# Patient Record
Sex: Male | Born: 1960
Health system: Southern US, Community
[De-identification: ages and names within clinical notes are randomized; demographics above are authoritative.]

## PROBLEM LIST (undated history)

## (undated) DIAGNOSIS — E785 Hyperlipidemia, unspecified: Secondary | ICD-10-CM

## (undated) DIAGNOSIS — D869 Sarcoidosis, unspecified: Secondary | ICD-10-CM

## (undated) DIAGNOSIS — B019 Varicella without complication: Secondary | ICD-10-CM

## (undated) HISTORY — PX: BRONCHOSCOPY: SUR163

## (undated) HISTORY — PX: OTHER SURGICAL HISTORY: SHX169

## (undated) HISTORY — DX: Varicella without complication: B01.9

## (undated) HISTORY — DX: Sarcoidosis, unspecified: D86.9

## (undated) HISTORY — DX: Hyperlipidemia, unspecified: E78.5

## (undated) HISTORY — PX: COLONOSCOPY: SHX174

## (undated) HISTORY — PX: CARPAL TUNNEL RELEASE: SHX101

---

## 2008-05-01 ENCOUNTER — Ambulatory Visit: Payer: Self-pay | Admitting: Family Medicine

## 2008-05-01 ENCOUNTER — Encounter: Admission: RE | Admit: 2008-05-01 | Discharge: 2008-05-01 | Payer: Self-pay | Admitting: Family Medicine

## 2009-05-31 ENCOUNTER — Ambulatory Visit: Payer: Self-pay | Admitting: Family Medicine

## 2009-07-10 ENCOUNTER — Ambulatory Visit: Payer: Self-pay | Admitting: Family Medicine

## 2010-03-21 ENCOUNTER — Ambulatory Visit: Payer: Self-pay | Admitting: Family Medicine

## 2010-10-15 ENCOUNTER — Ambulatory Visit (INDEPENDENT_AMBULATORY_CARE_PROVIDER_SITE_OTHER): Payer: BC Managed Care – PPO | Admitting: Family Medicine

## 2010-10-15 DIAGNOSIS — K1120 Sialoadenitis, unspecified: Secondary | ICD-10-CM

## 2011-04-22 ENCOUNTER — Ambulatory Visit (INDEPENDENT_AMBULATORY_CARE_PROVIDER_SITE_OTHER)
Admission: RE | Admit: 2011-04-22 | Discharge: 2011-04-22 | Disposition: A | Discharge status: Home / Self Care | Payer: BC Managed Care – PPO | Source: Ambulatory Visit | Attending: Pulmonary Disease | Admitting: Pulmonary Disease

## 2011-04-22 ENCOUNTER — Ambulatory Visit (INDEPENDENT_AMBULATORY_CARE_PROVIDER_SITE_OTHER): Payer: BC Managed Care – PPO | Admitting: Pulmonary Disease

## 2011-04-22 ENCOUNTER — Encounter: Payer: Self-pay | Admitting: Pulmonary Disease

## 2011-04-22 ENCOUNTER — Other Ambulatory Visit: Payer: Self-pay | Admitting: Pulmonary Disease

## 2011-04-22 VITALS — BP 120/76 | HR 80 | Temp 99.0°F | Ht 66.0 in | Wt 165.8 lb

## 2011-04-22 DIAGNOSIS — R053 Chronic cough: Secondary | ICD-10-CM

## 2011-04-22 DIAGNOSIS — R05 Cough: Secondary | ICD-10-CM

## 2011-04-22 DIAGNOSIS — R059 Cough, unspecified: Secondary | ICD-10-CM

## 2011-04-22 MED ORDER — OMEPRAZOLE 40 MG PO CPDR: 40.0000 mg | DELAYED_RELEASE_CAPSULE | Freq: Two times a day (BID) | ORAL | Status: DC

## 2011-04-22 MED ORDER — BENZONATATE 100 MG PO CAPS: 200.0000 mg | ORAL_CAPSULE | Freq: Four times a day (QID) | ORAL | Status: AC | PRN

## 2011-04-22 MED ORDER — HYDROCOD POLST-CPM POLST ER 10-8 MG PO CP12: 1.0000 | ORAL_CAPSULE | Freq: Two times a day (BID) | ORAL | Status: DC | PRN

## 2011-04-22 NOTE — Patient Instructions (Signed)
Will check cxr today, and call you with results. Will treat with cyclical cough protocol.  Please see instruction sheet. No throat clearing (ask your family to point it out if you are doing this), keep hard candy in mouth from awakening to bedtime for next few weeks. Minimize voice use for next few weeks. Will treat aggressively for reflux with omeprazole 40mg  one in am and pm for at least 3 weeks. After finishing the 3 days of cyclical cough protocol, take chlorpheniramine 8mg  at bedtime for postnasal drip for a few weeks. followup with me 2 weeks after finishing the 3 day cyclical cough protocol.

## 2011-04-22 NOTE — Progress Notes (Signed)
  Subjective:    Patient ID: Christian Watson, male    DOB: [birth year], 50 y.o.   MRN: [id number]  HPI The patient is a 50 year old male who I've been asked to see for evaluation of chronic cough.  Patient states that he has had this for approximately 8 months, and started as a typical "cold".  His cold symptoms resolved, but his cough has persisted.  He is been treated with allergy medication for 2 months with no improvement, and ultimately was evaluated by ENT with laryngoscopy showing post cricoid erythema.  He was tried on omeprazole 40 mg a day for a few weeks, followed by Dexilant for 5 weeks, with no change.  He has had a CT of his sinuses this month which showed no acute process.  The patient states that his cough is dry and hacking in nature, and has day-to-day variability.  He does feel a tickle and globus sensation, but is unsure if he has been clearing his throat a lot.  He can think of nothing that has made his cough better, and is unsure if anything is making it worse.  He has no history of asthma, and denies any breathing issues.  He may have some postnasal drip, but denies any significant reflux symptoms.  He has not had a chest x-ray or spirometry.   Review of Systems  Constitutional: Negative for fever and unexpected weight change.  HENT: Positive for sore throat. Negative for ear pain, nosebleeds, congestion, rhinorrhea, sneezing, trouble swallowing, dental problem, postnasal drip and sinus pressure.   Eyes: Negative for redness and itching.  Respiratory: Positive for cough. Negative for chest tightness, shortness of breath and wheezing.   Cardiovascular: Negative for palpitations and leg swelling.  Gastrointestinal: Negative for nausea and vomiting.  Genitourinary: Negative for dysuria.  Musculoskeletal: Negative for joint swelling.  Skin: Negative for rash.  Neurological: Negative for headaches.  Hematological: Does not bruise/bleed easily.  Psychiatric/Behavioral: Negative for  dysphoric mood. The patient is not nervous/anxious.        Objective:   Physical Exam Constitutional:  Well developed, no acute distress  HENT:  Nares patent without discharge, increased turbinates with erythema  Oropharynx without exudate, palate and uvula are normal  Eyes:  Perrla, eomi, no scleral icterus  Neck:  No JVD, no TMG  Cardiovascular:  Normal rate, regular rhythm, no rubs or gallops.  No murmurs        Intact distal pulses  Pulmonary :  Normal breath sounds, no stridor or respiratory distress   No rales, rhonchi, or wheezing  Abdominal:  Soft, nondistended, bowel sounds present.  No tenderness noted.   Musculoskeletal:  No lower extremity edema noted.  Lymph Nodes:  No cervical lymphadenopathy noted  Skin:  No cyanosis noted  Neurologic:  Alert, appropriate, moves all 4 extremities without obvious deficit. =xam       Assessment & Plan:

## 2011-04-22 NOTE — Assessment & Plan Note (Signed)
The patient has a chronic cough of 8 months duration that I suspect is more upper rather than lower airway.  Spirometry today shows no evidence of airflow obstruction, and we will also check a chest x-ray for completeness.  I would like to treat him for all possible irritants to the back of his throat, including postnasal drip, reflux, and cough/throat clearing.  I have reviewed all of the behavioral treatments for chronic cough, and have also given him a patient information sheet for the cyclical cough protocol.  I will see him back in 2 weeks to check on his progress.

## 2011-04-24 ENCOUNTER — Ambulatory Visit (INDEPENDENT_AMBULATORY_CARE_PROVIDER_SITE_OTHER)
Admission: RE | Admit: 2011-04-24 | Discharge: 2011-04-24 | Disposition: A | Discharge status: Home / Self Care | Payer: BC Managed Care – PPO | Source: Ambulatory Visit | Attending: Pulmonary Disease | Admitting: Pulmonary Disease

## 2011-04-24 DIAGNOSIS — R053 Chronic cough: Secondary | ICD-10-CM

## 2011-04-24 DIAGNOSIS — R05 Cough: Secondary | ICD-10-CM

## 2011-04-24 DIAGNOSIS — R059 Cough, unspecified: Secondary | ICD-10-CM

## 2011-04-24 MED ORDER — IOHEXOL 300 MG/ML  SOLN
80.0000 mL | Freq: Once | INTRAMUSCULAR | Status: AC | PRN
  Administered 2011-04-24: 80 mL via INTRAVENOUS

## 2011-04-28 ENCOUNTER — Telehealth: Payer: Self-pay | Admitting: Family Medicine

## 2011-04-28 ENCOUNTER — Telehealth: Payer: Self-pay | Admitting: Pulmonary Disease

## 2011-04-28 NOTE — Telephone Encounter (Signed)
FYI

## 2011-04-28 NOTE — Telephone Encounter (Signed)
Pt is requesting CT results from 04-24-11. Please advise. Carron Curie, CMA

## 2011-04-28 NOTE — Telephone Encounter (Signed)
Christian Watson, he needs to be set up for bronch.   Found out what tue/wed/thurs works for him, keeping in mind recent bronchs we have set up.  730 start time.  He will need fluoro though, small scope, no tb risk.

## 2011-04-29 NOTE — Telephone Encounter (Signed)
[id number] 

## 2011-04-29 NOTE — Telephone Encounter (Signed)
Returning Megan's call can be reached at [phone].Raylene Everts

## 2011-04-29 NOTE — Telephone Encounter (Signed)
Called and spoke with pt.  Pt requests a thurs appt .  Called and spoke with Marcelino Duster at [facility].  Scheduled pt for 05/15/11 at 7:30 am.    Called and spoke with pt.  Informed to arrive at [facility] admitting on 05/15/11 at 6:15am.  NPO after midnight, will need someone with him to drive him home after procedure.   Pt aware of this information and denies any questions.  Also mailed pt a bronch information sheet with all the above info

## 2011-05-15 ENCOUNTER — Other Ambulatory Visit: Payer: Self-pay | Admitting: Pulmonary Disease

## 2011-05-15 ENCOUNTER — Ambulatory Visit (HOSPITAL_COMMUNITY)
Admission: RE | Admit: 2011-05-15 | Discharge: 2011-05-15 | Disposition: A | Discharge status: Home / Self Care | Payer: BC Managed Care – PPO | Source: Ambulatory Visit | Attending: Pulmonary Disease | Admitting: Pulmonary Disease

## 2011-05-15 DIAGNOSIS — R059 Cough, unspecified: Secondary | ICD-10-CM | POA: Insufficient documentation

## 2011-05-15 DIAGNOSIS — R05 Cough: Secondary | ICD-10-CM | POA: Insufficient documentation

## 2011-05-15 DIAGNOSIS — J984 Other disorders of lung: Secondary | ICD-10-CM

## 2011-05-15 DIAGNOSIS — R918 Other nonspecific abnormal finding of lung field: Secondary | ICD-10-CM | POA: Insufficient documentation

## 2011-05-15 DIAGNOSIS — J8410 Pulmonary fibrosis, unspecified: Secondary | ICD-10-CM | POA: Insufficient documentation

## 2011-05-15 LAB — BODY FLUID CELL COUNT WITH DIFFERENTIAL: Total Nucleated Cell Count, Fluid: 1000 cu mm (ref 0–1000)

## 2011-05-17 LAB — CULTURE, RESPIRATORY W GRAM STAIN

## 2011-05-20 ENCOUNTER — Telehealth: Payer: Self-pay | Admitting: Pulmonary Disease

## 2011-05-20 DIAGNOSIS — D869 Sarcoidosis, unspecified: Secondary | ICD-10-CM

## 2011-05-20 NOTE — Telephone Encounter (Signed)
Called and spoke with pt and he is aware that [facility] out of town until Monday.  Pt is aware that i will forward this message to [facility] so he will be able to call the pt with his results.  Pt is very anxious for his results.  thanks

## 2011-05-21 ENCOUNTER — Other Ambulatory Visit: Payer: Self-pay | Admitting: *Deleted

## 2011-05-21 DIAGNOSIS — D869 Sarcoidosis, unspecified: Secondary | ICD-10-CM | POA: Insufficient documentation

## 2011-05-21 MED ORDER — PREDNISONE 20 MG PO TABS: ORAL_TABLET | ORAL | Status: DC

## 2011-05-21 NOTE — Telephone Encounter (Signed)
PT'S SPOUSE DEBRA STATES THAT WAITING UNTIL NEXT WEEK TO GET RESULTS OF BIOPSY IS "NOT AN OPTION". PT IS ANXIOUS TO GET THESE RESULTS NOW. WOULD LIKE ANOTHER DR. TO VIEW RESULTS AND CALL PT AT HOME #. Christian Watson

## 2011-05-21 NOTE — Telephone Encounter (Signed)
Dr. Sherene Sires, will you please look at these results and advise until [facility] returns to office next?  Thank you.

## 2011-05-21 NOTE — Telephone Encounter (Signed)
Discussed by c/w sarcoid, Dr Shelle Iron to arrange f/u and rx but nothing new for now

## 2011-05-28 NOTE — Op Note (Signed)
  NAME:  ANTHONY, TAMBURO NO.:  [id number]  MEDICAL RECORD NO.:  [id number]  LOCATION:  RESP                         FACILITY:  [facility]  PHYSICIAN:  Barbaraann Share, MD,FCCPDATE OF BIRTH:  [birth year] 08, 1961  DATE OF PROCEDURE:  05/15/2011 DATE OF DISCHARGE:                              OPERATIVE REPORT   PROCEDURE:  Flexible fiberoptic bronchoscopy with video.  OPERATOR:  Barbaraann Share, MD, FCCP.  INDICATION:  Pulmonary infiltrates of unknown origin with persistent cough.  ANESTHESIA:  Versed 10 mg IV, Demerol 100 mg IV, topical 1% lidocaine to vocal cords and airways during the procedure.  DESCRIPTION:  After obtaining informed consent, the patient was re- examined and the above sedation was then given.  The fiberoptic scope was passed to the right naris and into the posterior pharynx where there was no lesions or other abnormalities seen.  Vocal cords appeared to be within normal limits and moved bilaterally on phonation.  Scope was then passed into the trachea where it was examined along its entire length down to the level of the carina.  The left and right tracheobronchial trees were examined serially to subsegmental level with no endobronchial abnormality being noted.  Attention was then paid to the right upper lobe where bronchoalveolar lavage was done from the anterior segment of the right upper lobe as well as transbronchial lung biopsies under fluoroscopic guidance.  Attention was then paid to the right middle lobe where BAL was done as well as transbronchial lung biopsies.  Overall, the patient tolerated the procedure quite well and there were no complications.  He remained hemodynamically stable throughout the procedure.  Chest x-ray is pending at time of dictation to rule out pneumothorax, post transbronchial biopsies.     Barbaraann Share, MD,FCCP     [facility]  D:  05/15/2011  T:  05/15/2011  Job:  [id number]  Electronically Signed by Marcelyn Bruins  MDFCCP on 05/28/2011 08:41:02 AM

## 2011-06-11 LAB — FUNGUS CULTURE W SMEAR

## 2011-06-18 ENCOUNTER — Ambulatory Visit (INDEPENDENT_AMBULATORY_CARE_PROVIDER_SITE_OTHER)
Admission: RE | Admit: 2011-06-18 | Discharge: 2011-06-18 | Disposition: A | Discharge status: Home / Self Care | Payer: BC Managed Care – PPO | Source: Ambulatory Visit | Attending: Pulmonary Disease | Admitting: Pulmonary Disease

## 2011-06-18 ENCOUNTER — Ambulatory Visit (INDEPENDENT_AMBULATORY_CARE_PROVIDER_SITE_OTHER): Payer: BC Managed Care – PPO | Admitting: Pulmonary Disease

## 2011-06-18 ENCOUNTER — Encounter: Payer: Self-pay | Admitting: Pulmonary Disease

## 2011-06-18 VITALS — BP 136/80 | HR 83 | Temp 98.5°F | Ht 66.0 in | Wt 168.8 lb

## 2011-06-18 DIAGNOSIS — D869 Sarcoidosis, unspecified: Secondary | ICD-10-CM

## 2011-06-18 MED ORDER — PREDNISONE 20 MG PO TABS: ORAL_TABLET | ORAL | Status: DC

## 2011-06-18 NOTE — Patient Instructions (Addendum)
Decrease prednisone 20mg  to 1 1/2 tabs each day for 2 weeks, then to one tab each day until next visit with me. Will schedule for breathing studies at next visit to establish lung function baseline.  Will also check cxr next visit.  followup with me in 4 weeks.

## 2011-06-18 NOTE — Assessment & Plan Note (Addendum)
The patient is much improved since being on his prednisone, and states that his cough is at least 50% better.  He has no other significant pulmonary symptoms.  I have had a very long discussion with him about the diagnosis of sarcoid, and explained to him that prednisone treat his symptoms, but does not influence the actual disease.  My hope would be to maintain him on prednisone for approximately 2-3 months to resolve his cough, and then try him off the medication for a period of time to see if there is a recurrence.  I have written out a tapering dose schedule for his prednisone, and we'll see him back in 4 weeks with a followup chest x-ray and also full pulmonary function studies to establish his stable baseline.

## 2011-06-18 NOTE — Progress Notes (Signed)
  Subjective:    Patient ID: KYL GIVLER, male    DOB: 12-27-1960, 50 y.o.   MRN: [id number]  HPI The pt comes in today for f/u after his recent bronchoscopy.  He was found to have non-necrotizing granulomas most c/w diagnosis of sarcoid.  The patient has been started on prednisone at 40 mg a day, and comes in for followup.  He has seen a significant improvement in his cough of at least 50%, and has done well overall with the high-dose prednisone.  He denies any significant dyspnea on exertion or lower extremity edema.  He has not had any chest congestion and mucus production.  He did have a chest x-ray today prior to the visit which shows significant improvement in bilateral nodular infiltrates.   Review of Systems  Constitutional: Negative for fever and unexpected weight change.  HENT: Negative for ear pain, nosebleeds, congestion, sore throat, rhinorrhea, sneezing, trouble swallowing, dental problem, postnasal drip and sinus pressure.   Eyes: Negative for redness and itching.  Respiratory: Positive for cough. Negative for chest tightness, shortness of breath and wheezing.   Cardiovascular: Negative for palpitations and leg swelling.  Gastrointestinal: Negative for nausea and vomiting.  Genitourinary: Negative for dysuria.  Musculoskeletal: Negative for joint swelling.  Skin: Negative for rash.  Neurological: Negative for headaches.  Hematological: Does not bruise/bleed easily.  Psychiatric/Behavioral: Negative for dysphoric mood. The patient is not nervous/anxious.        Objective:   Physical Exam Well-developed male in no acute distress Nose without purulence or discharge noted Chest with clear breath sounds throughout, no wheezes or crackles Cardiac exam with regular rate and rhythm Lower extremities without edema, no cyanosis noted Alert and oriented, moves all 4.          Assessment & Plan:

## 2011-06-25 ENCOUNTER — Telehealth: Payer: Self-pay | Admitting: Pulmonary Disease

## 2011-06-25 NOTE — Telephone Encounter (Signed)
Pt is aware that he needs an eye exam due to his sarcoid and that they need to make sure he has no uveitis. He says he will let Dr. [place] know.

## 2011-06-25 NOTE — Telephone Encounter (Signed)
If he just tells his eye doctor he has sarcoid, and to make sure he does not have uveitis.

## 2011-06-25 NOTE — Telephone Encounter (Signed)
I spoke with Christian Watson and he states Christian Watson advised him he should have an eye exam due to his sarcoid. Christian Watson states when he called his eye doctor at [facility] on [address], they stated they did not know what Christian Watson wanted them to check for. Christian Watson would like to know so he can inform his eye doctor. Please advise Dr. Shelle Iron, thanks

## 2011-06-27 LAB — AFB CULTURE WITH SMEAR (NOT AT ARMC)

## 2011-07-21 ENCOUNTER — Ambulatory Visit (INDEPENDENT_AMBULATORY_CARE_PROVIDER_SITE_OTHER)
Admission: RE | Admit: 2011-07-21 | Discharge: 2011-07-21 | Disposition: A | Discharge status: Home / Self Care | Payer: BC Managed Care – PPO | Source: Ambulatory Visit | Attending: Pulmonary Disease | Admitting: Pulmonary Disease

## 2011-07-21 ENCOUNTER — Ambulatory Visit (INDEPENDENT_AMBULATORY_CARE_PROVIDER_SITE_OTHER): Payer: BC Managed Care – PPO | Admitting: Pulmonary Disease

## 2011-07-21 ENCOUNTER — Encounter: Payer: Self-pay | Admitting: Pulmonary Disease

## 2011-07-21 VITALS — BP 120/82 | HR 84 | Temp 98.3°F | Ht 66.0 in | Wt 169.0 lb

## 2011-07-21 DIAGNOSIS — D869 Sarcoidosis, unspecified: Secondary | ICD-10-CM

## 2011-07-21 LAB — PULMONARY FUNCTION TEST

## 2011-07-21 MED ORDER — PREDNISONE 5 MG PO TABS: ORAL_TABLET | ORAL | Status: DC

## 2011-07-21 NOTE — Progress Notes (Signed)
PFT was done today.  

## 2011-07-21 NOTE — Progress Notes (Signed)
  Subjective:    Patient ID: Christian Watson, male    DOB: October 24, 1960, 50 y.o.   MRN: [id number]  HPI Patient comes in today for followup of his known sarcoidosis, primarily manifesting as cough.  He's been treated with a tapering course of prednisone, and is down to 20 mg a day.  His cough has totally resolved, and he denies any breathing issues.  He does think that he feels much better.  His pulmonary function studies today are totally normal.  His chest x-ray shows significant improvement from his original, but only minimal improvement from his prior a month ago.   Review of Systems  Constitutional: Negative for fever and unexpected weight change.  HENT: Negative for ear pain, nosebleeds, congestion, sore throat, rhinorrhea, sneezing, trouble swallowing, dental problem, postnasal drip and sinus pressure.   Eyes: Negative for redness and itching.  Respiratory: Negative for cough, chest tightness, shortness of breath and wheezing.   Cardiovascular: Negative for palpitations and leg swelling.  Gastrointestinal: Negative for nausea and vomiting.  Genitourinary: Negative for dysuria.  Musculoskeletal: Negative for joint swelling.  Skin: Negative for rash.  Neurological: Negative for headaches.  Hematological: Does not bruise/bleed easily.  Psychiatric/Behavioral: Negative for dysphoric mood. The patient is not nervous/anxious.        Objective:   Physical Exam Well-developed male in no acute distress Nose without purulence or discharge noted Chest totally clear to auscultation, no wheezes Cardiac exam with regular rate and rhythm Lower extremities without edema, no cyanosis noted Alert oriented, moves all 4 extremities.       Assessment & Plan:

## 2011-07-21 NOTE — Patient Instructions (Signed)
Decrease prednisone to 15mg  a day for 7 days, then 10mg  a day for 7 days, then 5mg  a day for 7 days, then stop.  Please call me if the cough begins to return. followup with me in 4mos if doing well.

## 2011-07-21 NOTE — Assessment & Plan Note (Signed)
The patient has done very well on tapering prednisone, and is symptom-free on his current dose.  The plan will be to wean him off prednisone completely, and monitor for return of symptoms.  If his cough does return, we'll start him on inhaled corticosteroids in the form of Qvar.  The patient is to followup with me in 4 months, but is to call if his symptoms do return during his taper or once he is off the prednisone.

## 2011-08-11 ENCOUNTER — Telehealth: Payer: Self-pay | Admitting: Pulmonary Disease

## 2011-08-11 NOTE — Telephone Encounter (Signed)
I spoke with pt and he states he has developed a dry cough x the weekend. He does not get anything up. Denies any wheezing, chest tightness, SOB, fever, nausea, vomiting. Pt will finish prednisone tomorrow. He states KC advised him if he started coughing again to call him. Please advise Dr. Shelle Iron, thanks

## 2011-08-11 NOTE — Telephone Encounter (Signed)
Called and spoke with pt and informed him of KC's recs.  Pt states he will come by tomorrow morning around 8:30 am to be shown how to use the qvar and to be given the sample.  Will leave message in my box to document once i discuss inhaler with pt.

## 2011-08-11 NOTE — Telephone Encounter (Signed)
The plan was to start him on qvar if his cough returned off prednisone See if we have a sample of qvar, and have him come by to get and get instructed how to use. qvar  2 inhalations am and pm.  Rinse mouth well.

## 2011-08-12 NOTE — Telephone Encounter (Signed)
Pt came in this morning and i instructed him on how to use the qvar.  Pt verbalized understanding and denied any questions.

## 2011-08-20 ENCOUNTER — Encounter: Payer: Self-pay | Admitting: Pulmonary Disease

## 2011-11-17 ENCOUNTER — Ambulatory Visit (INDEPENDENT_AMBULATORY_CARE_PROVIDER_SITE_OTHER): Payer: BC Managed Care – PPO | Admitting: Pulmonary Disease

## 2011-11-17 ENCOUNTER — Encounter: Payer: Self-pay | Admitting: Pulmonary Disease

## 2011-11-17 VITALS — BP 120/78 | HR 74 | Temp 98.0°F | Ht 66.0 in | Wt 172.8 lb

## 2011-11-17 DIAGNOSIS — D869 Sarcoidosis, unspecified: Secondary | ICD-10-CM

## 2011-11-17 NOTE — Assessment & Plan Note (Signed)
The patient is doing very well from a pulmonary standpoint.  He has no significant cough, and feels that his exertional tolerance as normal.  I would like to keep him off all medication for now, but he is to call if his cough worsens.  He is to follow up with me in 6 months, and we will check a chest x-ray at that time.

## 2011-11-17 NOTE — Progress Notes (Signed)
  Subjective:    Patient ID: Christian Watson, male    DOB: 10/10/1960, 51 y.o.   MRN: [id number]  HPI The patient comes in today for followup of his known sarcoidosis.  This primarily manifested as cough, and no significant shortness of breath.  He has been treated with a short course of prednisone with near total resolution.  He currently rates his cough a 0-1/10, and denies any shortness of breath issues.   Review of Systems  Constitutional: Negative for fever and unexpected weight change.  HENT: Negative for ear pain, nosebleeds, congestion, sore throat, rhinorrhea, sneezing, trouble swallowing, dental problem, postnasal drip and sinus pressure.   Eyes: Negative for redness and itching.  Respiratory: Positive for cough. Negative for chest tightness, shortness of breath and wheezing.   Cardiovascular: Negative for palpitations and leg swelling.  Gastrointestinal: Negative for nausea and vomiting.  Genitourinary: Negative for dysuria.  Musculoskeletal: Negative for joint swelling.  Skin: Negative for rash.  Neurological: Negative for headaches.  Hematological: Does not bruise/bleed easily.  Psychiatric/Behavioral: Negative for dysphoric mood. The patient is not nervous/anxious.        Objective:   Physical Exam Well-developed male in no acute distress Nose without purulence or discharge noted Chest totally clear to auscultation Cardiac exam regular rate and rhythm, 1/6 systolic murmur Lower extremities without edema, no cyanosis Alert and oriented, moves all 4 extremities.       Assessment & Plan:

## 2011-11-17 NOTE — Patient Instructions (Signed)
Stay active, let us know if cough worsens followup with me in 6mos, and will do followup cxr same visit

## 2012-05-17 ENCOUNTER — Ambulatory Visit (INDEPENDENT_AMBULATORY_CARE_PROVIDER_SITE_OTHER)
Admission: RE | Admit: 2012-05-17 | Discharge: 2012-05-17 | Disposition: A | Discharge status: Home / Self Care | Payer: BC Managed Care – PPO | Source: Ambulatory Visit | Attending: Pulmonary Disease | Admitting: Pulmonary Disease

## 2012-05-17 ENCOUNTER — Ambulatory Visit (INDEPENDENT_AMBULATORY_CARE_PROVIDER_SITE_OTHER): Payer: BC Managed Care – PPO | Admitting: Pulmonary Disease

## 2012-05-17 ENCOUNTER — Encounter: Payer: Self-pay | Admitting: Pulmonary Disease

## 2012-05-17 VITALS — BP 120/80 | HR 76 | Temp 98.6°F | Ht 66.0 in | Wt 165.4 lb

## 2012-05-17 DIAGNOSIS — R0602 Shortness of breath: Secondary | ICD-10-CM

## 2012-05-17 DIAGNOSIS — D869 Sarcoidosis, unspecified: Secondary | ICD-10-CM

## 2012-05-17 MED ORDER — PREDNISONE 10 MG PO TABS: ORAL_TABLET | ORAL | Status: DC

## 2012-05-17 NOTE — Progress Notes (Signed)
  Subjective:    Patient ID: Christian Watson, male    DOB: 11-Aug-1960, 51 y.o.   MRN: [id number]  HPI The patient comes in today for followup of his known sarcoidosis.  He is fairly asymptomatic, and the only issue he has had is a mild chronic cough with some dyspnea on exertion.  His last episode was last fall, and responded well to a few week course of prednisone.  He has had no issues in total recently, when he has noticed an increased dry cough and some shortness of breath.  He is also feeling some tightness in his chest.  He denies any mucus or purulence.   Review of Systems  Constitutional: Negative for fever and unexpected weight change.  HENT: Negative for ear pain, nosebleeds, congestion, sore throat, rhinorrhea, sneezing, trouble swallowing, dental problem, postnasal drip and sinus pressure.   Eyes: Negative for redness and itching.  Respiratory: Positive for cough and shortness of breath. Negative for chest tightness and wheezing.   Cardiovascular: Negative for palpitations and leg swelling.  Gastrointestinal: Negative for nausea and vomiting.  Genitourinary: Negative for dysuria.  Musculoskeletal: Negative for joint swelling.  Skin: Negative for rash.  Neurological: Negative for headaches.  Hematological: Does not bruise/bleed easily.  Psychiatric/Behavioral: Negative for dysphoric mood. The patient is not nervous/anxious.        Objective:   Physical Exam Well-developed male in no acute distress Nose without purulence or discharge noted Oropharynx clear Chest totally clear to auscultation, no wheezing Cardiac exam with regular rate and rhythm Lower extremities without edema, no cyanosis Alert and oriented, moves all 4 extremities.       Assessment & Plan:

## 2012-05-17 NOTE — Assessment & Plan Note (Signed)
The patient has done very well overall from a sarcoid standpoint.  His chest x-ray today is stable, but he has had some increased cough and shortness of breath.  His sarcoid has always manifested primarily as airway symptoms.  I have discussed with him possible treatment with a few weeks of prednisone, versus putting him on inhaled corticosteroids.  The patient would rather take the prednisone for quick relief, but is willing to go on inhaled medication if this is going to be a recurring theme.  He is to let me know if his cough persists or comes back off prednisone.  He will need pulmonary function studies at his next visit.

## 2012-05-17 NOTE — Patient Instructions (Addendum)
Will treat you with prednisone, but let me know if your symptoms do not resolve or if they return after discontinuing the prednisone followup with me in 6mos, but call if having symptoms.

## 2012-11-15 ENCOUNTER — Ambulatory Visit: Payer: BC Managed Care – PPO | Admitting: Pulmonary Disease

## 2012-11-17 ENCOUNTER — Ambulatory Visit (INDEPENDENT_AMBULATORY_CARE_PROVIDER_SITE_OTHER): Payer: BC Managed Care – PPO | Admitting: Pulmonary Disease

## 2012-11-17 ENCOUNTER — Encounter: Payer: Self-pay | Admitting: Pulmonary Disease

## 2012-11-17 VITALS — BP 114/74 | HR 66 | Temp 98.5°F | Wt 156.2 lb

## 2012-11-17 DIAGNOSIS — D869 Sarcoidosis, unspecified: Secondary | ICD-10-CM

## 2012-11-17 NOTE — Progress Notes (Signed)
  Subjective:    Patient ID: Christian Watson, male    DOB: 01-20-1961, 52 y.o.   MRN: [id number]  HPI Patient comes in today for followup of his pulmonary sarcoidosis.  He has done very well overall, and has not had a recurrence of his cough and no shortness of breath.  He denies any joint or skin symptoms.  He is overdue for his pulmonary function studies for monitoring.   Review of Systems  Constitutional: Negative for fever and unexpected weight change.  HENT: Negative for ear pain, nosebleeds, congestion, sore throat, rhinorrhea, sneezing, trouble swallowing, dental problem, postnasal drip and sinus pressure.   Eyes: Negative for redness and itching.  Respiratory: Negative for cough, chest tightness, shortness of breath and wheezing.   Cardiovascular: Negative for palpitations and leg swelling.  Gastrointestinal: Negative for nausea and vomiting.  Genitourinary: Negative for dysuria.  Musculoskeletal: Negative for joint swelling.  Skin: Negative for rash.  Neurological: Negative for headaches.  Hematological: Does not bruise/bleed easily.  Psychiatric/Behavioral: Negative for dysphoric mood. The patient is not nervous/anxious.        Objective:   Physical Exam Well-developed male in no acute distress Nose without purulence or discharge noted Neck without lymphadenopathy or thyromegaly Chest totally clear to auscultation, no wheezing Cardiac exam with regular rate and rhythm Lower extremities without edema, no cyanosis Alert and oriented, moves all 4 extremities.       Assessment & Plan:

## 2012-11-17 NOTE — Patient Instructions (Addendum)
Stay active  Will schedule for breathing studies over the next 4-8 weeks at your convenience.  Will call you with results. followup with me in one year, but call if change in breathing or your cough returns.

## 2012-11-17 NOTE — Assessment & Plan Note (Signed)
The patient is doing very well from a sarcoid standpoint.  He has no shortness of breath or cough at this time, and feels that he is doing well.  He denies any constitutional symptoms.  I would like to schedule him for pulmonary function studies to make sure that his lung function is remaining stable, and we'll see him back in one year.

## 2012-12-15 ENCOUNTER — Ambulatory Visit (INDEPENDENT_AMBULATORY_CARE_PROVIDER_SITE_OTHER): Payer: BC Managed Care – PPO | Admitting: Pulmonary Disease

## 2012-12-15 DIAGNOSIS — D869 Sarcoidosis, unspecified: Secondary | ICD-10-CM

## 2012-12-15 LAB — PULMONARY FUNCTION TEST

## 2012-12-15 NOTE — Progress Notes (Signed)
PFT done today. 

## 2012-12-27 ENCOUNTER — Telehealth: Payer: Self-pay | Admitting: Pulmonary Disease

## 2012-12-27 NOTE — Telephone Encounter (Signed)
Please let pt know that breathing studies are stable.  Look good.

## 2012-12-28 NOTE — Telephone Encounter (Signed)
Results have been explained to patient, pt expressed understanding. Nothing further needed.  

## 2013-03-11 ENCOUNTER — Ambulatory Visit (INDEPENDENT_AMBULATORY_CARE_PROVIDER_SITE_OTHER): Payer: BC Managed Care – PPO | Admitting: Family Medicine

## 2013-03-11 ENCOUNTER — Encounter: Payer: Self-pay | Admitting: Family Medicine

## 2013-03-11 VITALS — BP 130/70 | Temp 101.0°F | Wt 152.0 lb

## 2013-03-11 DIAGNOSIS — J209 Acute bronchitis, unspecified: Secondary | ICD-10-CM

## 2013-03-11 MED ORDER — AMOXICILLIN 875 MG PO TABS: 875.0000 mg | ORAL_TABLET | Freq: Two times a day (BID) | ORAL | Status: DC

## 2013-03-11 NOTE — Progress Notes (Signed)
  Subjective:    Patient ID: Christian Watson, male    DOB: [birth year], 52 y.o.   MRN: [id number]  HPI He has a five-day history this started with rhinorrhea, nasal congestion and chest tightness. He did try some theraflu. Yesterday he noted fever, chills, worsening cough and congestion. No earache or sore throat. He does not smoke and has no allergies.  Review of Systems     Objective:   Physical Exam alert and in no distress. Tympanic membranes and canals are normal. Throat is clear. Tonsils are normal. Neck is supple without adenopathy or thyromegaly. Cardiac exam shows a regular sinus rhythm without murmurs or gallops. Lungs are clear to auscultation.        Assessment & Plan:  Acute bronchitis - Plan: amoxicillin (AMOXIL) 875 MG tablet  I explained that since he has a fever and increasing fatigue and in the fact that it is the weekend, I will place him on Amoxil. Recommend he take the full dosing and call if not entirely better.

## 2013-03-11 NOTE — Patient Instructions (Signed)
Take all the antibiotic and if not totally back to normal call me 

## 2013-11-17 ENCOUNTER — Ambulatory Visit (INDEPENDENT_AMBULATORY_CARE_PROVIDER_SITE_OTHER): Payer: BC Managed Care – PPO | Admitting: Pulmonary Disease

## 2013-11-17 ENCOUNTER — Ambulatory Visit (INDEPENDENT_AMBULATORY_CARE_PROVIDER_SITE_OTHER)
Admission: RE | Admit: 2013-11-17 | Discharge: 2013-11-17 | Disposition: A | Discharge status: Home / Self Care | Payer: BC Managed Care – PPO | Source: Ambulatory Visit | Attending: Pulmonary Disease | Admitting: Pulmonary Disease

## 2013-11-17 ENCOUNTER — Encounter (INDEPENDENT_AMBULATORY_CARE_PROVIDER_SITE_OTHER): Payer: Self-pay

## 2013-11-17 ENCOUNTER — Encounter: Payer: Self-pay | Admitting: Pulmonary Disease

## 2013-11-17 VITALS — BP 124/78 | HR 72 | Temp 99.2°F | Ht 66.0 in | Wt 166.8 lb

## 2013-11-17 DIAGNOSIS — D869 Sarcoidosis, unspecified: Secondary | ICD-10-CM

## 2013-11-17 NOTE — Assessment & Plan Note (Signed)
The patient is doing well from a sarcoid this is standpoint, with very few symptoms and nothing that is impacting his quality of life. His pulmonary function studies were totally normal last year, and I will check a chest x-ray today for completeness. Will see him back in one year, but he is to call me if he has increased symptoms.

## 2013-11-17 NOTE — Patient Instructions (Signed)
Will check chest xray today, and call you with results. followup with me in one year, but call if issues arise.

## 2013-11-17 NOTE — Progress Notes (Signed)
   Subjective:    Patient ID: Christian Watson, male    DOB: 08/06/1960, 53 y.o.   MRN: [id number]  HPI The patient comes in today for followup of his known pulmonary sarcoidosis. He is been doing well since last visit, with no significant shortness of breath or cough. He also denies any constitutional symptoms. His last pulmonary function studies were last May and were totally normal. He is due for a chest x-ray today.   Review of Systems  Constitutional: Negative for fever and unexpected weight change.  HENT: Negative for congestion, dental problem, ear pain, nosebleeds, postnasal drip, rhinorrhea, sinus pressure, sneezing, sore throat and trouble swallowing.   Eyes: Negative for redness and itching.  Respiratory: Positive for cough and shortness of breath. Negative for chest tightness and wheezing.        Occasional issues  Cardiovascular: Negative for palpitations and leg swelling.  Gastrointestinal: Negative for nausea and vomiting.  Genitourinary: Negative for dysuria.  Musculoskeletal: Negative for joint swelling.  Skin: Negative for rash.  Neurological: Negative for headaches.  Hematological: Does not bruise/bleed easily.  Psychiatric/Behavioral: Negative for dysphoric mood. The patient is not nervous/anxious.        Objective:   Physical Exam Wd male in nad Nose without purulence or d/c noted. Neck without LN or TMG Chest with totally clear bs, no wheezing or crackles. Cor with rrr LE without edema, no cyanosis Alert and oriented, moves all 4.        Assessment & Plan:

## 2013-11-29 ENCOUNTER — Telehealth: Payer: Self-pay | Admitting: Family Medicine

## 2013-11-29 NOTE — Telephone Encounter (Signed)
Per Dr. Susann GivensLalonde note called pt advised needs CPE he states no, not right now.  He will call us when ready.

## 2014-05-24 ENCOUNTER — Encounter: Payer: Self-pay | Admitting: Internal Medicine

## 2014-05-24 ENCOUNTER — Ambulatory Visit (INDEPENDENT_AMBULATORY_CARE_PROVIDER_SITE_OTHER): Payer: BC Managed Care – PPO | Admitting: Internal Medicine

## 2014-05-24 VITALS — BP 140/82 | HR 73 | Temp 99.3°F | Ht 66.0 in | Wt 172.0 lb

## 2014-05-24 DIAGNOSIS — R059 Cough, unspecified: Secondary | ICD-10-CM

## 2014-05-24 DIAGNOSIS — R05 Cough: Secondary | ICD-10-CM

## 2014-05-24 DIAGNOSIS — D869 Sarcoidosis, unspecified: Secondary | ICD-10-CM

## 2014-05-24 MED ORDER — PREDNISONE 10 MG PO TABS: ORAL_TABLET | ORAL | Status: DC

## 2014-05-24 MED ORDER — AZITHROMYCIN 250 MG PO TABS: ORAL_TABLET | ORAL | Status: DC

## 2014-05-24 MED ORDER — TRAMADOL HCL 50 MG PO TABS: ORAL_TABLET | ORAL | Status: DC

## 2014-05-24 NOTE — Progress Notes (Signed)
Subjective:     Patient ID: Christian SearingSteve K Watson, male   DOB: 19-Dec-1960 MRN: [id number]  HPI  [id number] yowm never smoker dx with h/o sarcoid required prednisone short courses only per Dr Shelle Ironlance  Presented with chronic cough originally  CT chest 03/2011:  LN, pulmonary nodular infiltrates, spleen abnl.  FOB 2012:  TBBX with non-necrotizing granulomas, negative stains.  Pred 40mg /day 05/2011 tapered to off.  Total resolution of cough PFT's 06/2011:  Totally normal  CXR 04/2012:  No change mild ISLD Pred course 04/2012 for cough.  PFT's 2014:  Totally normal   05/24/2014 acute  [facility] office visit/ [facility]   Chief Complaint  Patient presents with  . Acute Visit    Pt states "got a cold a couple wks ago"- c/o persistant cough since then- prod with minimal yellow sputum.  He also c/o chest congestion and minimal SOB.       No obvious day to day or daytime variabilty or assoc chronic cough or cp or chest tightness, subjective wheeze overt sinus or hb symptoms. No unusual exp hx or h/o childhood pna/ asthma or knowledge of premature birth.  Sleeping ok without nocturnal  or early am exacerbation  of respiratory  c/o's or need for noct saba. Also denies any obvious fluctuation of symptoms with weather or environmental changes or other aggravating or alleviating factors except as outlined above   Current Medications, Allergies, Complete Past Medical History, Past Surgical History, Family History, and Social History were reviewed in [facility] electronic medical record.  ROS  The following are not active complaints unless bolded sore throat, dysphagia, dental problems, itching, sneezing,  nasal congestion or excess/ purulent secretions, ear ache,   fever, chills, sweats, unintended wt loss, pleuritic or exertional cp, hemoptysis,  orthopnea pnd or leg swelling, presyncope, palpitations, heartburn, abdominal pain, anorexia, nausea, vomiting, diarrhea  or change in bowel or urinary habits, change  in stools or urine, dysuria,hematuria,  rash, arthralgias, visual complaints, headache, numbness weakness or ataxia or problems with walking or coordination,  change in mood/affect or memory.      Review of Systems     Objective:   Physical Exam amb wm nad  Wt Readings from Last 3 Encounters:  05/24/14 172 lb (78.019 kg)  11/17/13 166 lb 12.8 oz (75.66 kg)  03/11/13 152 lb (68.947 kg)      HEENT: nl dentition, turbinates, and orophanx. Nl external ear canals without cough reflex   NECK :  without JVD/Nodes/TM/ nl carotid upstrokes bilaterally   LUNGS: no acc muscle use, clear to A and P bilaterally without cough on insp or exp maneuvers   CV:  RRR  no s3 or murmur or increase in P2, no edema   ABD:  soft and nontender with nl excursion in the supine position. No bruits or organomegaly, bowel sounds nl  MS:  warm without deformities, calf tenderness, cyanosis or clubbing  SKIN: warm and dry without lesions    NEURO:  alert, approp, no deficits     cxr 11/17/13 1. No acute cardiopulmonary disease.  2. Grossly unchanged perihilar interstitial opacities and suspected  hilar adenopathy - findings compatible with provided history of  pulmonary sarcoidosis.     Assessment:

## 2014-05-24 NOTE — Patient Instructions (Addendum)
zpak Prednisone 10 mg take  4 each am x 2 days,   2 each am x 2 days,  1 each am x 2 days and stop  Pepcid 20 mg after supper until no longer coughing.   Take delsym two tsp every 12 hours and supplement if needed with  tramadol 50 mg up to 2 every 4 hours to suppress the urge to cough. Swallowing water or using ice chips/non mint and menthol containing candies (such as lifesavers or sugarless jolly ranchers) are also effective.  You should rest your voice and avoid activities that you know make you cough.  Once you have eliminated the cough for 3 straight days try reducing the tramadol first,  then the delsym as tolerated.

## 2014-05-25 DIAGNOSIS — R059 Cough, unspecified: Secondary | ICD-10-CM | POA: Insufficient documentation

## 2014-05-25 DIAGNOSIS — R05 Cough: Secondary | ICD-10-CM | POA: Insufficient documentation

## 2014-05-25 NOTE — Assessment & Plan Note (Signed)
Presented with chronic cough CT chest 03/2011:  LN, pulmonary nodular infiltrates, spleen abnl.  FOB 2012:  TBBX with non-necrotizing granulomas, negative stains.  Pred 40mg /day 05/2011 tapered to off.  Total resolution of cough PFT's 06/2011:  Totally normal  CXR 04/2012:  No change mild ISLD Pred course 04/2012 for cough.  PFT's 2014:  Totally normal   Based on abrupt onset with uri like prodrome doubt the cough is now due to flare of sarcoid but if not eliminated with rx (see cough) asked him to return to see Dr Shelle Ironlance to regroup within 2 weeks with a cxr

## 2014-05-25 NOTE — Assessment & Plan Note (Addendum)
Explained the natural history of uri  And it's relationship to lingering cough and cyclical mechanisms that can ensue  Will rx with zpak as very low likelihood any significant bacterial pathogen involved and it has antiinflammatory properties that may help plus eliminate cyclical cough with tramadol and add noct h2 also empirically to reduce likelihood of noct acid related cough.  See instructions for specific recommendations which were reviewed directly with the patient who was given a copy with highlighter outlining the key components.

## 2014-11-20 ENCOUNTER — Ambulatory Visit (INDEPENDENT_AMBULATORY_CARE_PROVIDER_SITE_OTHER)
Admission: RE | Admit: 2014-11-20 | Discharge: 2014-11-20 | Disposition: A | Discharge status: Home / Self Care | Payer: 59 | Source: Ambulatory Visit | Attending: Pulmonary Disease | Admitting: Pulmonary Disease

## 2014-11-20 ENCOUNTER — Ambulatory Visit (INDEPENDENT_AMBULATORY_CARE_PROVIDER_SITE_OTHER): Payer: 59 | Admitting: Pulmonary Disease

## 2014-11-20 ENCOUNTER — Encounter: Payer: Self-pay | Admitting: Pulmonary Disease

## 2014-11-20 ENCOUNTER — Encounter (INDEPENDENT_AMBULATORY_CARE_PROVIDER_SITE_OTHER): Payer: Self-pay

## 2014-11-20 VITALS — BP 122/76 | HR 79 | Temp 98.0°F | Ht 66.0 in | Wt 164.6 lb

## 2014-11-20 DIAGNOSIS — D869 Sarcoidosis, unspecified: Secondary | ICD-10-CM | POA: Diagnosis not present

## 2014-11-20 NOTE — Progress Notes (Signed)
   Subjective:    Patient ID: Christian Watson, male    DOB: 1961/05/29, 54 y.o.   MRN: [id number]  HPI The patient comes in today for follow-up of his known sarcoidosis. He feels that his breathing is at baseline, and has no issues with exertional tolerance. He is not having any cough, joint complaints, or constitutional symptoms.   Review of Systems  Constitutional: Negative for fever and unexpected weight change.  HENT: Negative for congestion, dental problem, ear pain, nosebleeds, postnasal drip, rhinorrhea, sinus pressure, sneezing, sore throat and trouble swallowing.   Eyes: Negative for redness and itching.  Respiratory: Negative for cough, chest tightness, shortness of breath and wheezing.   Cardiovascular: Negative for palpitations and leg swelling.  Gastrointestinal: Negative for nausea and vomiting.  Genitourinary: Negative for dysuria.  Musculoskeletal: Negative for joint swelling.  Skin: Negative for rash.  Neurological: Negative for headaches.  Hematological: Does not bruise/bleed easily.  Psychiatric/Behavioral: Negative for dysphoric mood. The patient is not nervous/anxious.        Objective:   Physical Exam Well-developed male in no acute distress Nose without purulence or discharge noted Neck without lymphadenopathy or thyromegaly Chest totally clear to auscultation, no crackles Cardiac exam with regular rate and rhythm Lower extremities without edema, no cyanosis Alert and oriented, moves all 4 extremities.       Assessment & Plan:

## 2014-11-20 NOTE — Assessment & Plan Note (Signed)
The patient continues to do well from a sarcoid standpoint, with no dyspnea on exertion or cough at this time. He has no joint complaints or constitutional symptoms. Will check a chest x-ray today, and if stable, would not recommend any treatment for this since he is asymptomatic.

## 2014-11-20 NOTE — Patient Instructions (Signed)
Will check chest xray today and call you with results. followup in one year if doing well, but call if issues.

## 2015-04-03 ENCOUNTER — Ambulatory Visit (INDEPENDENT_AMBULATORY_CARE_PROVIDER_SITE_OTHER): Payer: 59 | Admitting: Emergency Medicine

## 2015-04-03 ENCOUNTER — Encounter: Payer: Self-pay | Admitting: Emergency Medicine

## 2015-04-03 VITALS — BP 122/84 | HR 87 | Temp 98.2°F | Resp 18 | Ht 66.0 in | Wt 166.0 lb

## 2015-04-03 DIAGNOSIS — S61011A Laceration without foreign body of right thumb without damage to nail, initial encounter: Secondary | ICD-10-CM

## 2015-04-03 DIAGNOSIS — Z23 Encounter for immunization: Secondary | ICD-10-CM

## 2015-04-03 DIAGNOSIS — M79644 Pain in right finger(s): Secondary | ICD-10-CM

## 2015-04-03 DIAGNOSIS — M25541 Pain in joints of right hand: Secondary | ICD-10-CM

## 2015-04-03 NOTE — Progress Notes (Signed)
Subjective:  Patient ID: Christian Watson, male    DOB: July 13, 1961  Age: 54 y.o. MRN: [id number]  CC: Laceration   HPI Christian Watson presents  following a laceration to the right thumb on a knife. He has no other injury. He is not current on tetanus. Took no medication for it. This happened a short, no  History Christian Watson has no past medical history on file.   He has past surgical history that includes cyst removed (as a child).   His  family history includes Cancer in his maternal grandfather and paternal grandmother; Heart disease in his maternal grandfather, maternal grandmother, paternal grandfather, and paternal grandmother.  He   reports that he has never smoked. He does not have any smokeless tobacco history on file. He reports that he does not use illicit drugs. His alcohol history is not on file.  No outpatient prescriptions prior to visit.   No facility-administered medications prior to visit.    Social History   Social History  . Marital Status: Married    Spouse Name: N/A  . Number of Children: Y  . Years of Education: N/A   Occupational History  . business owner    Social History Main Topics  . Smoking status: Never Smoker   . Smokeless tobacco: None  . Alcohol Use: None  . Drug Use: No  . Sexual Activity: Not Asked   Other Topics Concern  . None   Social History Narrative     Review of Systems  Constitutional: Negative for fever, chills and appetite change.  HENT: Negative for congestion, ear pain, postnasal drip, sinus pressure and sore throat.   Eyes: Negative for pain and redness.  Respiratory: Negative for cough, shortness of breath and wheezing.   Cardiovascular: Negative for leg swelling.  Gastrointestinal: Negative for nausea, vomiting, abdominal pain, diarrhea, constipation and blood in stool.  Endocrine: Negative for polyuria.  Genitourinary: Negative for dysuria, urgency, frequency and flank pain.  Musculoskeletal: Negative  for gait problem.  Skin: Negative for rash.  Neurological: Negative for weakness and headaches.  Psychiatric/Behavioral: Negative for confusion and decreased concentration. The patient is not nervous/anxious.     Objective:  BP 122/84 mmHg  Pulse 87  Temp(Src) 98.2 F (36.8 C) (Oral)  Resp 18  Ht  (1.676 m)  Wt 166 lb (75.297 kg)  BMI 26.81 kg/m2  SpO2 98%  Physical Exam  Constitutional: He is oriented to person, place, and time. He appears well-developed and well-nourished. No distress.  HENT:  Head: Normocephalic and atraumatic.  Right Ear: External ear normal.  Left Ear: External ear normal.  Nose: Nose normal.  Eyes: Conjunctivae and EOM are normal. Pupils are equal, round, and reactive to light. No scleral icterus.  Neck: Normal range of motion. Neck supple. No tracheal deviation present.  Cardiovascular: Normal rate, regular rhythm and normal heart sounds.   Pulmonary/Chest: Effort normal. No respiratory distress. He has no wheezes. He has no rales.  Abdominal: He exhibits no mass. There is no tenderness. There is no rebound and no guarding.  Musculoskeletal: He exhibits no edema.  Lymphadenopathy:    He has no cervical adenopathy.  Neurological: He is alert and oriented to person, place, and time. Coordination normal.  Skin: Skin is warm and dry. No rash noted.  Psychiatric: He has a normal mood and affect. His behavior is normal.      Assessment & Plan:   Christian Watson was seen today for laceration.  Diagnoses and all orders  for this visit:  Laceration of thumb, right, initial encounter  Pain in thumb joint with movement, right  Other orders -     Tdap vaccine greater than or equal to 7yo IM   Christian Watson does not currently have medications on file.  No orders of the defined types were placed in this encounter.    Appropriate red flag conditions were discussed with the patient as well as actions that should be taken.  Patient expressed his  understanding.  Follow-up: Return for suture removal in 7-10 days.  Carmelina Dane, MD

## 2015-04-03 NOTE — Progress Notes (Signed)
I directly observed Ms. Race and participated in the laceration repair on this patient. Verbal consent obtained from patient.  Local anesthesia with 5cc Lidocaine 2% without epinephrine.  Wound scrubbed with soap and water and rinsed.  Wound closed with #4 4-0 Prolene simple interrupted sutures.  Wound cleansed and dressed.

## 2015-04-03 NOTE — Patient Instructions (Addendum)
Tdap Vaccine (Tetanus, Diphtheria, Pertussis): What You Need to Know 1. Why get vaccinated? Tetanus, diphtheria and pertussis can be very serious diseases, even for adolescents and adults. Tdap vaccine can protect us from these diseases. TETANUS (Lockjaw) causes painful muscle tightening and stiffness, usually all over the body.  It can lead to tightening of muscles in the head and neck so you can't open your mouth, swallow, or sometimes even breathe. Tetanus kills about 1 out of 5 people who are infected. DIPHTHERIA can cause a thick coating to form in the back of the throat.  It can lead to breathing problems, paralysis, heart failure, and death. PERTUSSIS (Whooping Cough) causes severe coughing spells, which can cause difficulty breathing, vomiting and disturbed sleep.  It can also lead to weight loss, incontinence, and rib fractures. Up to 2 in 100 adolescents and 5 in 100 adults with pertussis are hospitalized or have complications, which could include pneumonia or death. These diseases are caused by bacteria. Diphtheria and pertussis are spread from person to person through coughing or sneezing. Tetanus enters the body through cuts, scratches, or wounds. Before vaccines, the [place] saw as many as 200,000 cases a year of diphtheria and pertussis, and hundreds of cases of tetanus. Since vaccination began, tetanus and diphtheria have dropped by about 99% and pertussis by about 80%. 2. Tdap vaccine Tdap vaccine can protect adolescents and adults from tetanus, diphtheria, and pertussis. One dose of Tdap is routinely given at age 11 or 12. People who did not get Tdap at that age should get it as soon as possible. Tdap is especially important for health care professionals and anyone having close contact with a baby younger than 12 months. Pregnant women should get a dose of Tdap during every pregnancy, to protect the newborn from pertussis. Infants are most at risk for severe, life-threatening  complications from pertussis. A similar vaccine, called Td, protects from tetanus and diphtheria, but not pertussis. A Td booster should be given every 10 years. Tdap may be given as one of these boosters if you have not already gotten a dose. Tdap may also be given after a severe cut or burn to prevent tetanus infection. Your doctor can give you more information. Tdap may safely be given at the same time as other vaccines. 3. Some people should not get this vaccine  If you ever had a life-threatening allergic reaction after a dose of any tetanus, diphtheria, or pertussis containing vaccine, OR if you have a severe allergy to any part of this vaccine, you should not get Tdap. Tell your doctor if you have any severe allergies.  If you had a coma, or long or multiple seizures within 7 days after a childhood dose of DTP or DTaP, you should not get Tdap, unless a cause other than the vaccine was found. You can still get Td.  Talk to your doctor if you:  have epilepsy or another nervous system problem,  had severe pain or swelling after any vaccine containing diphtheria, tetanus or pertussis,  ever had Guillain-Barr Syndrome (GBS),  aren't feeling well on the day the shot is scheduled. 4. Risks of a vaccine reaction With any medicine, including vaccines, there is a chance of side effects. These are usually mild and go away on their own, but serious reactions are also possible. Brief fainting spells can follow a vaccination, leading to injuries from falling. Sitting or lying down for about 15 minutes can help prevent these. Tell your doctor if you feel   dizzy or light-headed, or have vision changes or ringing in the ears. Mild problems following Tdap (Did not interfere with activities)  Pain where the shot was given (about 3 in 4 adolescents or 2 in 3 adults)  Redness or swelling where the shot was given (about 1 person in 5)  Mild fever of at least 100.4F (up to about 1 in 25 adolescents or  1 in 100 adults)  Headache (about 3 or 4 people in 10)  Tiredness (about 1 person in 3 or 4)  Nausea, vomiting, diarrhea, stomach ache (up to 1 in 4 adolescents or 1 in 10 adults)  Chills, body aches, sore joints, rash, swollen glands (uncommon) Moderate problems following Tdap (Interfered with activities, but did not require medical attention)  Pain where the shot was given (about 1 in 5 adolescents or 1 in 100 adults)  Redness or swelling where the shot was given (up to about 1 in 16 adolescents or 1 in 25 adults)  Fever over 102F (about 1 in 100 adolescents or 1 in 250 adults)  Headache (about 3 in 20 adolescents or 1 in 10 adults)  Nausea, vomiting, diarrhea, stomach ache (up to 1 or 3 people in 100)  Swelling of the entire arm where the shot was given (up to about 3 in 100). Severe problems following Tdap (Unable to perform usual activities; required medical attention)  Swelling, severe pain, bleeding and redness in the arm where the shot was given (rare). A severe allergic reaction could occur after any vaccine (estimated less than 1 in a million doses). 5. What if there is a serious reaction? What should I look for?  Look for anything that concerns you, such as signs of a severe allergic reaction, very high fever, or behavior changes. Signs of a severe allergic reaction can include hives, swelling of the face and throat, difficulty breathing, a fast heartbeat, dizziness, and weakness. These would start a few minutes to a few hours after the vaccination. What should I do?  If you think it is a severe allergic reaction or other emergency that can't wait, call 9-1-1 or get the person to the nearest hospital. Otherwise, call your doctor.  Afterward, the reaction should be reported to the "Vaccine Adverse Event Reporting System" (VAERS). Your doctor might file this report, or you can do it yourself through the VAERS web site at www.vaers.hhs.gov, or by calling  1-800-822-7967. VAERS is only for reporting reactions. They do not give medical advice.  6. The National Vaccine Injury Compensation Program The National Vaccine Injury Compensation Program (VICP) is a federal program that was created to compensate people who may have been injured by certain vaccines. Persons who believe they may have been injured by a vaccine can learn about the program and about filing a claim by calling 1-800-338-2382 or visiting the VICP website at www.hrsa.gov/vaccinecompensation. 7. How can I learn more?  Ask your doctor.  Call your local or state health department.  Contact the Centers for Disease Control and Prevention (CDC):  Call 1-800-232-4636 or visit CDC's website at www.cdc.gov/vaccines. CDC Tdap Vaccine VIS (12/04/11) Document Released: 01/13/2012 Document Revised: 11/28/2013 Document Reviewed: 10/26/2013 ExitCare Patient Information 2015 [employer]. This information is not intended to replace advice given to you by your health care provider. Make sure you discuss any questions you have with your health care provider. Laceration Care, Adult A laceration is a cut or lesion that goes through all layers of the skin and into the tissue just beneath the   skin. TREATMENT  Some lacerations may not require closure. Some lacerations may not be able to be closed due to an increased risk of infection. It is important to see your caregiver as soon as possible after an injury to minimize the risk of infection and maximize the opportunity for successful closure. If closure is appropriate, pain medicines may be given, if needed. The wound will be cleaned to help prevent infection. Your caregiver will use stitches (sutures), staples, wound glue (adhesive), or skin adhesive strips to repair the laceration. These tools bring the skin edges together to allow for faster healing and a better cosmetic outcome. However, all wounds will heal with a scar. Once the wound has healed,  scarring can be minimized by covering the wound with sunscreen during the day for 1 full year. HOME CARE INSTRUCTIONS  For sutures or staples:  Keep the wound clean and dry.  If you were given a bandage (dressing), you should change it at least once a day. Also, change the dressing if it becomes wet or dirty, or as directed by your caregiver.  Wash the wound with soap and water 2 times a day. Rinse the wound off with water to remove all soap. Pat the wound dry with a clean towel.  After cleaning, apply a thin layer of the antibiotic ointment as recommended by your caregiver. This will help prevent infection and keep the dressing from sticking.  You may shower as usual after the first 24 hours. Do not soak the wound in water until the sutures are removed.  Only take over-the-counter or prescription medicines for pain, discomfort, or fever as directed by your caregiver.  Get your sutures or staples removed as directed by your caregiver. For skin adhesive strips:  Keep the wound clean and dry.  Do not get the skin adhesive strips wet. You may bathe carefully, using caution to keep the wound dry.  If the wound gets wet, pat it dry with a clean towel.  Skin adhesive strips will fall off on their own. You may trim the strips as the wound heals. Do not remove skin adhesive strips that are still stuck to the wound. They will fall off in time. For wound adhesive:  You may briefly wet your wound in the shower or bath. Do not soak or scrub the wound. Do not swim. Avoid periods of heavy perspiration until the skin adhesive has fallen off on its own. After showering or bathing, gently pat the wound dry with a clean towel.  Do not apply liquid medicine, cream medicine, or ointment medicine to your wound while the skin adhesive is in place. This may loosen the film before your wound is healed.  If a dressing is placed over the wound, be careful not to apply tape directly over the skin adhesive. This  may cause the adhesive to be pulled off before the wound is healed.  Avoid prolonged exposure to sunlight or tanning lamps while the skin adhesive is in place. Exposure to ultraviolet light in the first year will darken the scar.  The skin adhesive will usually remain in place for 5 to 10 days, then naturally fall off the skin. Do not pick at the adhesive film. You may need a tetanus shot if:  You cannot remember when you had your last tetanus shot.  You have never had a tetanus shot. If you get a tetanus shot, your arm may swell, get red, and feel warm to the touch. This is common and not   a problem. If you need a tetanus shot and you choose not to have one, there is a rare chance of getting tetanus. Sickness from tetanus can be serious. SEEK MEDICAL CARE IF:   You have redness, swelling, or increasing pain in the wound.  You see a red line that goes away from the wound.  You have yellowish-white fluid (pus) coming from the wound.  You have a fever.  You notice a bad smell coming from the wound or dressing.  Your wound breaks open before or after sutures have been removed.  You notice something coming out of the wound such as wood or glass.  Your wound is on your hand or foot and you cannot move a finger or toe. SEEK IMMEDIATE MEDICAL CARE IF:   Your pain is not controlled with prescribed medicine.  You have severe swelling around the wound causing pain and numbness or a change in color in your arm, hand, leg, or foot.  Your wound splits open and starts bleeding.  You have worsening numbness, weakness, or loss of function of any joint around or beyond the wound.  You develop painful lumps near the wound or on the skin anywhere on your body. MAKE SURE YOU:   Understand these instructions.  Will watch your condition.  Will get help right away if you are not doing well or get worse. Document Released: 07/14/2005 Document Revised: 10/06/2011 Document Reviewed:  01/07/2011 [facility] Patient Information 2015 [place], [place]. This information is not intended to replace advice given to you by your health care provider. Make sure you discuss any questions you have with your health care provider.

## 2015-04-03 NOTE — Progress Notes (Signed)
Laceration Repair   Verbal consent obtained. Digital block of right thumb with 5 cc 2% lidocaine without epinephrine. Wound scrubbed with soap and water. Wound closed with four simple interrupted 4-0 prolene sutures. Wound cleansed and dressed.

## 2015-11-07 ENCOUNTER — Ambulatory Visit (INDEPENDENT_AMBULATORY_CARE_PROVIDER_SITE_OTHER): Payer: 59 | Admitting: Adult Health

## 2015-11-07 ENCOUNTER — Encounter: Payer: Self-pay | Admitting: Adult Health

## 2015-11-07 VITALS — BP 138/74 | HR 76 | Temp 98.1°F | Ht 66.0 in | Wt 164.4 lb

## 2015-11-07 DIAGNOSIS — R059 Cough, unspecified: Secondary | ICD-10-CM

## 2015-11-07 DIAGNOSIS — D869 Sarcoidosis, unspecified: Secondary | ICD-10-CM | POA: Diagnosis not present

## 2015-11-07 DIAGNOSIS — R05 Cough: Secondary | ICD-10-CM

## 2015-11-07 MED ORDER — PREDNISONE 10 MG PO TABS: ORAL_TABLET | ORAL | Status: DC

## 2015-11-07 MED ORDER — AZITHROMYCIN 250 MG PO TABS: ORAL_TABLET | ORAL | Status: DC

## 2015-11-07 NOTE — Progress Notes (Signed)
  Chief Complaint  Patient presents with  . Acute Visit    Cough wiht minimal amount of clear mucus x 1 wk and chest tightness.  No increased SOB, wheezing, f/c/s, or body aches.       Tests 04/2011>>> FOB>>non-necrotizing granulomas most c/w dx sarcoid   Past medical hx No past medical history on file.   Past surgical hx, Allergies, Family hx, Social hx all reviewed.  No current outpatient prescriptions on file prior to visit.   No current facility-administered medications on file prior to visit.     Vital Signs BP 138/74 mmHg  Pulse 76  Temp(Src) 98.1 F (36.7 C) (Oral)  Ht 5\' 6"  (1.676 m)  Wt 164 lb 6.4 oz (74.571 kg)  BMI 26.55 kg/m2  SpO2 98%  History of Present Illness Christian Watson is a 55 y.o. male never smoker with hx sarcoid (FOB bx 2012), previously maintained on chronic prednisone but off for last few years.  Previously followed by Dr. Shelle Ironlance and will now be followed by Dr. Jamison NeighborNestor.  Presents today for acute OV with 1 week hx cough, chest tightness.  He had cold-like symptoms 2 weeks ago which improved but cough has lingered.  It has been persistent over last 7-10 days, feels worse at night.  Intermittently productive of clear mucous but mostly dry.  Denies fevers, chills, hemoptysis, purulent sputum, recent sick contacts, orthopnea, BLE edema.    Physical Exam   General - pleasant male, No distress  ENT - No sinus tenderness, no oral exudate, no LAN Cardiac - s1s2 regular, no murmur Chest - resps even non labored, intermittent dry cough, few scattered rhonchi - clears with cough, no audible wheeze  Back - No focal tenderness Abd - Soft, non-tender Ext - No edema Neuro - Normal strength Skin - No rashes Psych - normal mood, and behavior   Assessment/Plan  Sarcoidosis  Cough   Patient Instructions  Take 4 tabs PO daily x 3 days, then 3 tabs PO daily x 3 days, then 2 tabs PO daily x 3 days, then 1 tab PO daily x 3 days, then STOP Z-pac - take as  directed  mucinex DM (over the counter)  Use sugar free jolly ranchers, Vit C drops, ice chips to avoid coughing  Follow up with Dr. Jamison NeighborNestor as previously scheduled  Please contact office for sooner follow up if symptoms do not improve or worsen or seek emergency care     Dirk DressKaty Whiteheart, NP 11/07/2015  10:01 AM

## 2015-11-07 NOTE — Patient Instructions (Addendum)
Take 4 tabs PO daily x 3 days, then 3 tabs PO daily x 3 days, then 2 tabs PO daily x 3 days, then 1 tab PO daily x 3 days, then STOP Z-pac - take as directed  mucinex DM (over the counter)  Use sugar free jolly ranchers, Vit C drops, ice chips to avoid coughing  Follow up with Dr. Jamison NeighborNestor as previously scheduled  Please contact office for sooner follow up if symptoms do not improve or worsen or seek emergency care

## 2015-11-08 NOTE — Progress Notes (Signed)
Note reviewed.  Euriah Matlack E. Joua Bake, M.D. [facility] Pager:  [phone] After 3pm or if no response, call [phone] 2:09 AM 11/08/2015    

## 2015-11-20 ENCOUNTER — Ambulatory Visit: Payer: 59 | Admitting: Pulmonary Disease

## 2015-11-30 ENCOUNTER — Ambulatory Visit: Payer: 59 | Admitting: Pulmonary Disease

## 2016-01-01 ENCOUNTER — Ambulatory Visit: Payer: 59 | Admitting: Pulmonary Disease

## 2017-05-11 ENCOUNTER — Telehealth: Payer: Self-pay | Admitting: Pulmonary Disease

## 2017-05-11 NOTE — Telephone Encounter (Signed)
Spoke with patient. He stated that his wife was the one who called. He stated that he already has a PCP and does not need a referral.   Will close this message.

## 2017-05-13 ENCOUNTER — Ambulatory Visit (INDEPENDENT_AMBULATORY_CARE_PROVIDER_SITE_OTHER): Payer: 59 | Admitting: Pulmonary Disease

## 2017-05-13 ENCOUNTER — Ambulatory Visit (INDEPENDENT_AMBULATORY_CARE_PROVIDER_SITE_OTHER)
Admission: RE | Admit: 2017-05-13 | Discharge: 2017-05-13 | Disposition: A | Discharge status: Home / Self Care | Payer: 59 | Source: Ambulatory Visit | Attending: Pulmonary Disease | Admitting: Pulmonary Disease

## 2017-05-13 ENCOUNTER — Other Ambulatory Visit (INDEPENDENT_AMBULATORY_CARE_PROVIDER_SITE_OTHER): Payer: 59

## 2017-05-13 ENCOUNTER — Encounter: Payer: Self-pay | Admitting: Pulmonary Disease

## 2017-05-13 VITALS — BP 120/70 | HR 56 | Ht 66.0 in | Wt 152.5 lb

## 2017-05-13 DIAGNOSIS — R079 Chest pain, unspecified: Secondary | ICD-10-CM | POA: Diagnosis not present

## 2017-05-13 DIAGNOSIS — D869 Sarcoidosis, unspecified: Secondary | ICD-10-CM

## 2017-05-13 DIAGNOSIS — R1011 Right upper quadrant pain: Secondary | ICD-10-CM | POA: Diagnosis not present

## 2017-05-13 DIAGNOSIS — R109 Unspecified abdominal pain: Secondary | ICD-10-CM | POA: Insufficient documentation

## 2017-05-13 LAB — CBC WITH DIFFERENTIAL/PLATELET
BASOS ABS: 0 10*3/uL (ref 0.0–0.1)
Basophils Relative: 0.4 % (ref 0.0–3.0)
Eosinophils Absolute: 0 10*3/uL (ref 0.0–0.7)
Eosinophils Relative: 1.5 % (ref 0.0–5.0)
HEMATOCRIT: 40.1 % (ref 39.0–52.0)
Hemoglobin: 13.6 g/dL (ref 13.0–17.0)
LYMPHS PCT: 22 % (ref 12.0–46.0)
Lymphs Abs: 0.7 10*3/uL (ref 0.7–4.0)
MCHC: 34 g/dL (ref 30.0–36.0)
MCV: 94.8 fl (ref 78.0–100.0)
MONOS PCT: 8.5 % (ref 3.0–12.0)
Monocytes Absolute: 0.3 10*3/uL (ref 0.1–1.0)
NEUTROS ABS: 2.1 10*3/uL (ref 1.4–7.7)
Neutrophils Relative %: 67.6 % (ref 43.0–77.0)
Platelets: 195 10*3/uL (ref 150.0–400.0)
RBC: 4.22 Mil/uL (ref 4.22–5.81)
RDW: 13.5 % (ref 11.5–15.5)
WBC: 3.2 10*3/uL — ABNORMAL LOW (ref 4.0–10.5)

## 2017-05-13 LAB — COMPREHENSIVE METABOLIC PANEL
ALK PHOS: 48 U/L (ref 39–117)
ALT: 24 U/L (ref 0–53)
AST: 28 U/L (ref 0–37)
Albumin: 4.4 g/dL (ref 3.5–5.2)
BILIRUBIN TOTAL: 0.5 mg/dL (ref 0.2–1.2)
BUN: 14 mg/dL (ref 6–23)
CO2: 27 meq/L (ref 19–32)
Calcium: 9.9 mg/dL (ref 8.4–10.5)
Chloride: 103 mEq/L (ref 96–112)
Creatinine, Ser: 0.91 mg/dL (ref 0.40–1.50)
GFR: 92.19 mL/min (ref >60.00)
Glucose, Bld: 103 mg/dL — ABNORMAL HIGH (ref 70–99)
Potassium: 4.4 mEq/L (ref 3.5–5.1)
SODIUM: 141 meq/L (ref 135–145)
TOTAL PROTEIN: 7.2 g/dL (ref 6.0–8.3)

## 2017-05-13 NOTE — Patient Instructions (Addendum)
   Call our office if you have any new breathing problems or questions before your next appointment.  We will contact you if your labs need to be addressed before your next appointment.   We will also contact you with your x-ray result from today.  We will be seeing you back in 3 months to review your pulmonary function testing & review your chest x-ray in person.   TESTS ORDERED: 1. Full PFTs before next appointment 2. Chest x-ray PA/LAT today 3. Serum CMP, CBC with differential, & ACE level today.

## 2017-05-13 NOTE — Addendum Note (Signed)
Addended by: Etheleen MayhewOX, Estreya Clay C on: 05/13/2017 09:40 AM   Modules accepted: Orders

## 2017-05-13 NOTE — Progress Notes (Signed)
Subjective:    Patient ID: Christian Watson, male    DOB: 04/26/1961, 56 y.o.   MRN: [id number]  C.C.:  Follow-up for Sarcoidosis.   HPI  Previously followed by Dr. Shelle Iron. Last seen April 2017.  Sarcoidosis:  Only has documented lung involvement. First manifested by persistent cough. Stage II based on radiographic imaging. He reports he does get a yearly ophthalmological exam. He denies any blurry vision, eye pain, or erythema. He reports his breathing is fine. He denies any dyspnea. He denies any cough currently. No wheezing. Initially he was treated with Prednisone and then tapered gradually. He thinks it may have been approximately 6 months. He has had smaller bursts of steroids twice since then due to a cough associated with a respiratory illness.   Review of Systems No chest pain, pressure or tightness. No palpitations. No syncope or near syncope. No focal weakness, numbness, or tingling. No focal vision loss. No rashes or bruising. He reports over the last couple of weeks he has noticed an intermittent pain in his right abdomen that occurred while exercising. No nausea or emesis.   No Known Allergies  No current outpatient prescriptions on file prior to visit.   No current facility-administered medications on file prior to visit.     Past Medical History:  Diagnosis Date  . Sarcoidosis     Past Surgical History:  Procedure Laterality Date  . BRONCHOSCOPY     Bronchial Wash & Transbronchial Biopsies RML & RUL  . CARPAL TUNNEL RELEASE Left   . COLONOSCOPY    . cyst removed  as a child   from chin    Family History  Problem Relation Age of Onset  . Heart disease Maternal Grandmother   . Heart disease Maternal Grandfather   . Cancer Maternal Grandfather        unsure what kind  . Heart disease Paternal Grandmother   . Cancer Paternal Grandmother        unsure what kind  . Heart disease Paternal Grandfather   . Heart disease Mother   . Hypertension Father   .  Diabetes Father   . Lung disease Neg Hx   . Sarcoidosis Neg Hx   . Rheumatologic disease Neg Hx     Social History   Social History  . Marital status: Married    Spouse name: N/A  . Number of children: Y  . Years of education: N/A   Occupational History  . business owner [employer]   Social History Main Topics  . Smoking status: Passive Smoke Exposure - Never Smoker  . Smokeless tobacco: Never Used     Comment: Second-hand from parents   . Alcohol use Yes     Comment: glass of wine with dinner  . Drug use: No  . Sexual activity: Not Asked   Other Topics Concern  . None   Social History Narrative   [facility] (05/13/17):   Originally from [place]. Has lived in [place], [place], & moved to [place] in 2005. 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 Has prior international travel to [place], [place], [place], [place], [place], [place], & [place]. Has a dog currently. No bird, mold, or hot tub exposure. Enjoys playing golf.       Objective:   Physical Exam BP 120/70 (BP Location: Left Arm, Cuff Size: Normal)   Pulse (!) 56   Ht 5\' 6"  (1.676 m)  Wt 152 lb 8 oz (69.2 kg)   SpO2 98%   BMI 24.61 kg/m  General:  Awake. Alert. No acute distress. Caucasian male.  Integument:  Warm & dry. No rash on exposed skin. No bruising on exposed skin. Extremities:  No cyanosis or clubbing.  HEENT:  Moist mucus membranes. No oral ulcers. No scleral injection or icterus.  Cardiovascular:  Regular rate. No edema. Regular rhythm.  Pulmonary:  Good aeration & clear to auscultation bilaterally. Symmetric chest wall expansion. No accessory muscle use on room air. Abdomen: Soft. Normal bowel sounds. Nondistended. Grossly nontender. No hepatosplenomegaly.  Musculoskeletal:  Normal bulk and tone. Hand grip strength 5/5 bilaterally. No joint deformity or effusion appreciated. Neurological:  Cranial nerves 2-12  grossly in tact. No meningismus. Moving all 4 extremities equally.   PFT 12/15/12: FVC 4.10 L (96%) FEV1 3.39 L (102%) FEV1/FVC 0.83 FEF 25-75 3.61 L (119%) negative bronchodilator response TLC 5.43 L (91%) RV 82% DLCO uncorrected 106% 07/21/11: FVC 4.08 L (94%) FEV1 3.16 L (96%) FEV1/FVC 0.77 FEF 25-75 2.81 L (81%) negative bronchodilator response TLC 6.71 L (114%) RV 139% ERV 80% DLCO uncorrected 97%  IMAGING CXR PA/LAT 11/20/14 (personally reviewed by me):  Prominent interstitial lung. No new focal opacity appreciated. No mass appreciated. No pleural effusion. Minimal bilateral hilar fullness. Heart normal in size. Mediastinum normal in contour.  CT CHEST W/ CONTRAST 04/24/11 (personally reviewed by me):  Nodular parenchymal opacities with mid and upper lung zones bilaterally. No pleural effusion or thickening. Enlarged mediastinal and hilar lymph nodes noted. No pericardial effusion.  CARDIAC EKG 05/13/17 (personally reviewed by me):  Sinus bradycardia. No conduction delay or arrhythmia. No suggestion of ischemia.   PATHOLOGY Transbronchial Biopsies RML & RUL (05/15/11):  Well formed nonnecrotizing granulomata and associated multinucleated giant cells. No evidence of atypia or malignancy. Stains negative for AFB, GMS, & PAS. Bronchial Wash RML & RUL (05/15/11):  Reactive bronchial epithelial cells and histiocytes. No malignant cells identified.  MICROBIOLOGY Bronchial Wash RML & RUL (05/15/11):  Normal Flora / AFB Negative / Fungus Negative     Assessment & Plan:  56 y.o. male with Stage II Sarcoidosis presumably only with lung involvement based on prior biopsy and imaging. Patient's sarcoidosis seems to be nonactive at this time. The etiology for his abdominal pain is unclear but I suspect is likely related to a musculoskeletal strain given it occurred while exercising at his gym. I did spend a significant amount of time today reviewing his history as well as discussing the fundamental  basics of sarcoidosis and warning signs for organ involvement of his eyes, heart, and CNS. I instructed him to contact our office if he had any new breathing problems or questions before his next appointment.   1. Stage II sarcoidosis:  Checking CXR PA/LAT today & full PFTs before next appointment. Also checking serum CBC, CMP & ACE today. Continuing to monitor with yearly EKG and ophthalmological exam.  2. Right upper quadrant abdominal pain:  Checking CMP today. Patient to have this evaluated by a general practitioner if it persists.  3. Maintenance: Status post Tdap September 2016. Patient declines immunizations today. 4. Follow-up: Return to clinic in 3 months or sooner if needed.   I have spent a total of 32 minutes of time today reviewing the patient's electronic medical record, examining the patient, and discussing the plan of care with the patient during his visit with more than 75% of that time spent in face-to-face contact.  Donna Christen Richy Spradley,  M.D. [facility] Pager:  [phone] After 7pm or if no response, call [phone] 9:18 AM 05/13/17

## 2017-05-14 ENCOUNTER — Telehealth: Payer: Self-pay | Admitting: Pulmonary Disease

## 2017-05-14 LAB — ANGIOTENSIN CONVERTING ENZYME: Angiotensin-Converting Enzyme: 32 U/L (ref 9–67)

## 2017-05-14 NOTE — Telephone Encounter (Signed)
Labs were essentially normal

## 2017-05-14 NOTE — Telephone Encounter (Signed)
Christian Watson spoke with pt about chest x ray results but labs were not reviewed. JN please advise.

## 2017-05-14 NOTE — Telephone Encounter (Signed)
Spoke with patient. He is aware of results. Nothing else needed at time of call.  

## 2017-05-22 ENCOUNTER — Ambulatory Visit (INDEPENDENT_AMBULATORY_CARE_PROVIDER_SITE_OTHER): Payer: 59 | Admitting: Family Medicine

## 2017-05-22 ENCOUNTER — Encounter: Payer: Self-pay | Admitting: Family Medicine

## 2017-05-22 VITALS — BP 130/70 | HR 72 | Resp 18 | Ht 66.0 in | Wt 154.8 lb

## 2017-05-22 DIAGNOSIS — M549 Dorsalgia, unspecified: Secondary | ICD-10-CM

## 2017-05-22 NOTE — Progress Notes (Signed)
   Subjective:    Patient ID: Christian Watson, male    DOB: 22-Aug-1960, 56 y.o.   MRN: [id number]  HPI He complains of a two-week history of right CVA pain. He noted this when he was exercising but does not remember any particular maneuver that made it worse. It is intermittent in nature and does tend to move from the CVA to the right upper quadrant. He cannot relate this to anything in particular. He's at occasional nausea but no vomiting, trouble eating or with BMs.   Review of Systems     Objective:   Physical Exam Alert and in no distress. Normal lumbar motion when getting out of the chair. Slight tenderness to palpation in the right paravertebral muscles CVA area.       Assessment & Plan:  Musculoskeletal back pain  I explained that this is most likely musculoskeletal since he had some palpable tenderness in the musculature. Recommended conservative care and stated that this will probably go away on its own. He was comfortable with that.

## 2017-06-03 ENCOUNTER — Ambulatory Visit: Payer: 59 | Admitting: Family Medicine

## 2017-06-03 ENCOUNTER — Encounter: Payer: Self-pay | Admitting: Family Medicine

## 2017-06-03 VITALS — BP 125/72 | HR 75 | Temp 98.3°F | Resp 20 | Ht 65.0 in | Wt 154.8 lb

## 2017-06-03 DIAGNOSIS — R1011 Right upper quadrant pain: Secondary | ICD-10-CM

## 2017-06-03 DIAGNOSIS — Z7689 Persons encountering health services in other specified circumstances: Secondary | ICD-10-CM | POA: Diagnosis not present

## 2017-06-03 DIAGNOSIS — M545 Low back pain, unspecified: Secondary | ICD-10-CM

## 2017-06-03 DIAGNOSIS — D869 Sarcoidosis, unspecified: Secondary | ICD-10-CM

## 2017-06-03 MED ORDER — CYCLOBENZAPRINE HCL 5 MG PO TABS: 5.0000 mg | ORAL_TABLET | Freq: Two times a day (BID) | ORAL | 0 refills | Status: DC | PRN

## 2017-06-03 MED ORDER — NAPROXEN 500 MG PO TABS: 500.0000 mg | ORAL_TABLET | Freq: Two times a day (BID) | ORAL | 0 refills | Status: DC

## 2017-06-03 NOTE — Progress Notes (Signed)
Patient ID: Christian Watson, male  DOB: Jan 05, 1961, 56 y.o.   MRN: [id number] Patient Care Team    Relationship Specialty Notifications Start End  [name], DO PCP - General Family Medicine  06/03/17     Chief Complaint  Patient presents with  . Establish Care  . Abdominal Pain    right side    Subjective:  Christian Watson is a 56 y.o.  male present for new patient establishment. All past medical history, surgical history, allergies, family history, immunizations, medications and social history were obtained and entered in the electronic medical record today. All recent labs, ED visits and hospitalizations within the last year were reviewed.  Patient presents today for establishment with complaints of right back and flank discomfort of one month duration. He feels like it is deep ache, dull pressure with sometimes burning sensation. He is concerned it has something to do with his liver. Patient has a history of sarcoidosis, followed by pulmonology. He reports his last checkup was actually good. Angiotensin-converting enzyme was 32 and his calcium was normal. Chest x-ray was normal, with a noted stable T7 compression. CT 2012 showed faint mesenteric edema, hepatic steatosis and imaging consistent with sarcoidosis of the lungs and spleen. Patient reports he works out very frequently. He does not recall an injury or overactivity injury to this area. He has not had a back injury in the past. He denies any fever, nausea, vomiting, sweating, diarrhea or dysuria. He endorses normal urine output. He did see a sports medicine doctor a few weeks ago he had a pulled muscle. No medications were provided. Patient has not been taking any medications.  Depression screen [facility] 2/9 06/03/2017 04/03/2015  Decreased Interest 0 0  Down, Depressed, Hopeless 0 0  PHQ - 2 Score 0 0   No flowsheet data found.    Current Exercise Habits: Structured exercise class, Type of exercise: strength  training/weights;calisthenics, Time (Minutes): 60, Frequency (Times/Week): 4, Weekly Exercise (Minutes/Week): 240, Intensity: Moderate   No flowsheet data found.   Immunization History  Administered Date(s) Administered  . Tdap 04/03/2015    No exam data present  Past Medical History:  Diagnosis Date  . Chicken pox   . Sarcoidosis    No Known Allergies Past Surgical History:  Procedure Laterality Date  . BRONCHOSCOPY     Bronchial Wash & Transbronchial Biopsies RML & RUL  . CARPAL TUNNEL RELEASE Left   . COLONOSCOPY    . cyst removed  as a child   from chin   Family History  Problem Relation Age of Onset  . Heart disease Maternal Grandmother   . Heart disease Maternal Grandfather   . Cancer Maternal Grandfather        unsure what kind  . Heart disease Paternal Grandmother   . Cancer Paternal Grandmother        unsure what kind  . Heart disease Paternal Grandfather   . Heart disease Mother   . Hypertension Father   . Diabetes Father   . Hearing loss Father   . Diabetes Sister   . Lung disease Neg Hx   . Sarcoidosis Neg Hx   . Rheumatologic disease Neg Hx    Social History   Socioeconomic History  . Marital status: Married    Spouse name: Not on file  . Number of children: Y  . Years of education: Not on file  . Highest education level: Not on file  Social Needs  .  Financial resource strain: Not on file  . Food insecurity - worry: Not on file  . Food insecurity - inability: Not on file  . Transportation needs - medical: Not on file  . Transportation needs - non-medical: Not on file  Occupational History  . Occupation: Occupational hygienist: [employer]  Tobacco Use  . Smoking status: Never Smoker  . Smokeless tobacco: Never Used  . Tobacco comment: Second-hand from parents   Substance and Sexual Activity  . Alcohol use: Yes    Comment: glass of wine with dinner  . Drug use: No  . Sexual activity: Yes    Partners: Female    Comment: Married    Other Topics Concern  . Not on file  Social History Narrative   Originally from [place]. Has lived in [place], [place], & moved to [place] in 2005. 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 Has prior international travel to [place], [place], [place], [place], [place], [place], & [place]. Has a dog currently. No bird, mold, or hot tub exposure. Enjoys playing golf.    Married. College educated. Business owner.   Exercises routinely.   Takes a multivitamin, drinks caffeine, uses herbal remedies.   Occasional alcohol.   Wears a bicycle helmet. Has a smoke alarm in the home. Wears his seatbelt.   Safe in his relationships.   Allergies as of 06/03/2017   No Known Allergies     Medication List        Accurate as of 06/03/17 11:59 PM. Always use your most recent med list.          cyclobenzaprine 5 MG tablet Commonly known as:  FLEXERIL Take 1 tablet (5 mg total) 2 (two) times daily as needed by mouth for muscle spasms.   naproxen 500 MG tablet Commonly known as:  NAPROSYN Take 1 tablet (500 mg total) 2 (two) times daily with a meal by mouth.       All past medical history, surgical history, allergies, family history, immunizations andmedications were updated in the EMR today and reviewed under the history and medication portions of their EMR.    Recent Results (from the past 2160 hour(s))  Comp Met (CMET)     Status: Abnormal   Collection Time: 05/13/17  9:51 AM  Result Value Ref Range   Sodium 141 135 - 145 mEq/L   Potassium 4.4 3.5 - 5.1 mEq/L   Chloride 103 96 - 112 mEq/L   CO2 27 19 - 32 mEq/L   Glucose, Bld 103 (H) 70 - 99 mg/dL   BUN 14 6 - 23 mg/dL   Creatinine, Ser 0.91 0.40 - 1.50 mg/dL   Total Bilirubin 0.5 0.2 - 1.2 mg/dL   Alkaline Phosphatase 48 39 - 117 U/L   AST 28 0 - 37 U/L   ALT 24 0 - 53 U/L   Total Protein 7.2 6.0 - 8.3 g/dL   Albumin 4.4 3.5 - 5.2 g/dL   Calcium 9.9 8.4 -  10.5 mg/dL   GFR 92.19 >60.00 mL/min  CBC w/Diff     Status: Abnormal   Collection Time: 05/13/17  9:51 AM  Result Value Ref Range   WBC 3.2 (L) 4.0 - 10.5 K/uL   RBC 4.22 4.22 - 5.81 Mil/uL   Hemoglobin 13.6 13.0 - 17.0 g/dL   HCT 40.1 39.0 - 52.0 %   MCV 94.8 78.0 - 100.0 fl  MCHC 34.0 30.0 - 36.0 g/dL   RDW 13.5 11.5 - 15.5 %   Platelets 195.0 150.0 - 400.0 K/uL   Neutrophils Relative % 67.6 43.0 - 77.0 %   Lymphocytes Relative 22.0 12.0 - 46.0 %   Monocytes Relative 8.5 3.0 - 12.0 %   Eosinophils Relative 1.5 0.0 - 5.0 %   Basophils Relative 0.4 0.0 - 3.0 %   Neutro Abs 2.1 1.4 - 7.7 K/uL   Lymphs Abs 0.7 0.7 - 4.0 K/uL   Monocytes Absolute 0.3 0.1 - 1.0 K/uL   Eosinophils Absolute 0.0 0.0 - 0.7 K/uL   Basophils Absolute 0.0 0.0 - 0.1 K/uL  Angiotensin converting enzyme     Status: None   Collection Time: 05/13/17  9:51 AM  Result Value Ref Range   Angiotensin-Converting Enzyme 32 9 - 67 U/L  Comp Met (CMET)     Status: Abnormal   Collection Time: 06/03/17  4:08 PM  Result Value Ref Range   Sodium 140 135 - 145 mEq/L   Potassium 3.7 3.5 - 5.1 mEq/L   Chloride 100 96 - 112 mEq/L   CO2 31 19 - 32 mEq/L   Glucose, Bld 120 (H) 70 - 99 mg/dL   BUN 18 6 - 23 mg/dL   Creatinine, Ser 1.13 0.40 - 1.50 mg/dL   Total Bilirubin 0.8 0.2 - 1.2 mg/dL   Alkaline Phosphatase 53 39 - 117 U/L   AST 23 0 - 37 U/L   ALT 21 0 - 53 U/L   Total Protein 7.7 6.0 - 8.3 g/dL   Albumin 4.9 3.5 - 5.2 g/dL   Calcium 10.6 (H) 8.4 - 10.5 mg/dL   GFR 71.79 >60.00 mL/min    Dg Chest 2 View  Result Date: 05/13/2017 CLINICAL DATA:  History of sarcoidosis.  No current complaints. EXAM: CHEST  2 VIEW COMPARISON:  Chest x-ray of November 20, 2014 FINDINGS: The lungs are adequately inflated. There is no focal infiltrate. There is no pleural effusion. The heart and pulmonary vascularity are normal. The mediastinum is normal in width. The bony thorax exhibits no acute abnormality. There is stable partial  compression of the body of T7. There is degenerative disc disease of the lower lumbar spine. IMPRESSION: There is no active cardiopulmonary disease. Electronically Signed   By: David  Martinique M.D.   On: 05/13/2017 09:52     ROS: 14 pt review of systems performed and negative (unless mentioned in an HPI)  Objective: BP 125/72 (BP Location: Right Arm, Patient Position: Sitting, Cuff Size: Normal)   Pulse 75   Temp 98.3 F (36.8 C)   Resp 20   Ht _0  (1.651 m)   Wt 154 lb 12 oz (70.2 kg)   SpO2 97%   BMI 25.75 kg/m  Gen: Afebrile. No acute distress. Nontoxic in appearance, well-developed, well-nourished,  pleasant Caucasian male. HENT: AT. [place].MMM, no oral lesions no Cough on exam Eyes:Pupils Equal Round Reactive to light, Extraocular movements intact,  Conjunctiva without redness, discharge or icterus. Neck/lymp/endocrine: Supple, no lymphadenopathy CV: RRR no murmur, no edema Chest: CTAB, no wheeze, rhonchi or crackles. Normal Respiratory effort. Good Air movement. Abd: Soft. Flat. NTND. BS present. No Masses palpated. No hepatosplenomegaly. No rebound tenderness or guarding. Skin: No rashes, purpura or petechiae. Warm and well-perfused. Skin intact. Neuro/Msk:  Normal gait. PERLA. EOMi. Alert. Oriented x3.      - Spine: No erythema, no bony tenderness. No rib tenderness. Mild tenderness to palpation right lumbar musculature with  moderate soft doughy tissue texture change right lumbar paraspinal fullness ~L1. Full range of motion. Neurovascularly intact distally.  Assessment/plan: Christian Watson is a 56 y.o. male present for establishment of care. Sarcoidosis  Right upper quadrant abdominal pain Acute right-sided low back pain without sciatica - Exam is consistent with muscle strain of right lumbar paraspinal muscle. Discussed rest, ice versus heat, NSAIDs. Flexeril 5 mg twice a day when necessary. Naproxen twice a day 7 days prescribed. Patient advised to take with meal. After  7 days can use when necessary for discomfort. - CMP collected today to monitor liver function and kidney function to be complete. Do not think this is related to his sarcoid, however exam was a little odd with rather doughy almost masslike area of the right lumbar paraspinal. - Follow-up in 4 weeks, sooner if worsening.  Return in about 4 weeks (around 07/01/2017), or if symptoms worsen or fail to improve.   Note is dictated utilizing voice recognition software. Although note has been proof read prior to signing, occasional typographical errors still can be missed. If any questions arise, please do not hesitate to call for verification.  Electronically signed by: Howard Pouch, DO [place]

## 2017-06-03 NOTE — Patient Instructions (Signed)
Start naproxen every 12 hours with food for 5-7 days for inflammation and comfort  Start flexeril , muscle relaxer,  Before bed. If does not make you sedated, you can every 12 hours.    Use heat and massage. Rest. Hold on working out for a week.   Follow up in 2-3 weeks. This can be your physical so make sure to fast 9 hours (water only).  If no improvement in your back would image at that time.    Please help [place] help you:  We are honored you have chosen [facility] for your Primary Care home. Below you will find basic instructions that you may need to access in the future. Please help [place] help you by reading the instructions, which cover many of the frequent questions we experience.   Prescription refills and request:  -In order to allow more efficient response time, please call your pharmacy for all refills. They will forward the request electronically to [place]. This allows for the quickest possible response. Request left on a nurse line can take longer to refill, since these are checked as time allows between office patients and other phone calls.  - refill request can take up to 3-5 working days to complete.  - If request is sent electronically and request is appropiate, it is usually completed in 1-2 business days.  - all patients will need to be seen routinely for all chronic medical conditions requiring prescription medications (see follow-up below). If you are overdue for follow up on your condition, you will be asked to make an appointment and we will call in enough medication to cover you until your appointment (up to 30 days).  - all controlled substances will require a face to face visit to request/refill.  - if you desire your prescriptions to go through a new pharmacy, and have an active script at original pharmacy, you will need to call your pharmacy and have scripts transferred to new pharmacy. This is completed between the pharmacy locations and not by your provider.     Results: If any images or labs were ordered, it can take up to 1 week to get results depending on the test ordered and the lab/facility running and resulting the test. - Normal or stable results, which do not need further discussion, may be released to your mychart immediately with attached note to you. A call may not be generated for normal results. Please make certain to sign up for mychart. If you have questions on how to activate your mychart you can call the front office.  - If your results need further discussion, our office will attempt to contact you via phone, and if unable to reach you after 2 attempts, we will release your abnormal result to your mychart with instructions.  - All results will be automatically released in mychart after 1 week.  - Your provider will provide you with explanation and instruction on all relevant material in your results. Please keep in mind, results and labs may appear confusing or abnormal to the untrained eye, but it does not mean they are actually abnormal for you personally. If you have any questions about your results that are not covered, or you desire more detailed explanation than what was provided, you should make an appointment with your provider to do so.   Our office handles many outgoing and incoming calls daily. If we have not contacted you within 1 week about your results, please check your mychart to see if there  is a message first and if not, then contact our office.  In helping with this matter, you help decrease call volume, and therefore allow us to be able to respond to patients needs more efficiently.   Acute office visits (sick visit):  An acute visit is intended for a new problem and are scheduled in shorter time slots to allow schedule openings for patients with new problems. This is the appropriate visit to discuss a new problem. In order to provide you with excellent quality medical care with proper time for you to explain your problem,  have an exam and receive treatment with instructions, these appointments should be limited to one new problem per visit. If you experience a new problem, in which you desire to be addressed, please make an acute office visit, we save openings on the schedule to accommodate you. Please do not save your new problem for any other type of visit, let us take care of it properly and quickly for you.   Follow up visits:  Depending on your condition(s) your provider will need to see you routinely in order to provide you with quality care and prescribe medication(s). Most chronic conditions (Example: hypertension, Diabetes, depression/anxiety... etc), require visits a couple times a year. Your provider will instruct you on proper follow up for your personal medical conditions and history. Please make certain to make follow up appointments for your condition as instructed. Failing to do so could result in lapse in your medication treatment/refills. If you request a refill, and are overdue to be seen on a condition, we will always provide you with a 30 day script (once) to allow you time to schedule.    Medicare wellness (well visit): - we have a wonderful Nurse Selena Batten(Kim), that will meet with you and provide you will yearly medicare wellness visits. These visits should occur yearly (can not be scheduled less than 1 calendar year apart) and cover preventive health, immunizations, advance directives and screenings you are entitled to yearly through your medicare benefits. Do not miss out on your entitled benefits, this is when medicare will pay for these benefits to be ordered for you.  These are strongly encouraged by your provider and is the appropriate type of visit to make certain you are up to date with all preventive health benefits. If you have not had your medicare wellness exam in the last 12 months, please make certain to schedule one by calling the office and schedule your medicare wellness with Selena BattenKim as soon as  possible.   Yearly physical (well visit):  - Adults are recommended to be seen yearly for physicals. Check with your insurance and date of your last physical, most insurances require one calendar year between physicals. Physicals include all preventive health topics, screenings, medical exam and labs that are appropriate for gender/age and history. You may have fasting labs needed at this visit. This is a well visit (not a sick visit), new problems should not be covered during this visit (see acute visit).  - Pediatric patients are seen more frequently when they are younger. Your provider will advise you on well child visit timing that is appropriate for your their age. - This is not a medicare wellness visit. Medicare wellness exams do not have an exam portion to the visit. Some medicare companies allow for a physical, some do not allow a yearly physical. If your medicare allows a yearly physical you can schedule the medicare wellness with our nurse Selena BattenKim and have your physical with  your provider after, on the same day. Please check with insurance for your full benefits.   Late Policy/No Shows:  - all new patients should arrive 15-30 minutes earlier than appointment to allow us time  to  obtain all personal demographics,  insurance information and for you to complete office paperwork. - All established patients should arrive 10-15 minutes earlier than appointment time to update all information and be checked in .  - In our best efforts to run on time, if you are late for your appointment you will be asked to either reschedule or if able, we will work you back into the schedule. There will be a wait time to work you back in the schedule,  depending on availability.  - If you are unable to make it to your appointment as scheduled, please call 24 hours ahead of time to allow us to fill the time slot with someone else who needs to be seen. If you do not cancel your appointment ahead of time, you may be charged  a no show fee.

## 2017-06-04 LAB — COMPREHENSIVE METABOLIC PANEL
ALT: 21 U/L (ref 0–53)
AST: 23 U/L (ref 0–37)
Albumin: 4.9 g/dL (ref 3.5–5.2)
Alkaline Phosphatase: 53 U/L (ref 39–117)
BUN: 18 mg/dL (ref 6–23)
CALCIUM: 10.6 mg/dL — AB (ref 8.4–10.5)
CHLORIDE: 100 meq/L (ref 96–112)
CO2: 31 meq/L (ref 19–32)
Creatinine, Ser: 1.13 mg/dL (ref 0.40–1.50)
GFR: 71.79 mL/min (ref >60.00)
Glucose, Bld: 120 mg/dL — ABNORMAL HIGH (ref 70–99)
POTASSIUM: 3.7 meq/L (ref 3.5–5.1)
Sodium: 140 mEq/L (ref 135–145)
Total Bilirubin: 0.8 mg/dL (ref 0.2–1.2)
Total Protein: 7.7 g/dL (ref 6.0–8.3)

## 2017-06-05 ENCOUNTER — Telehealth: Payer: Self-pay | Admitting: Family Medicine

## 2017-06-05 ENCOUNTER — Encounter: Payer: Self-pay | Admitting: Family Medicine

## 2017-06-05 DIAGNOSIS — M545 Low back pain, unspecified: Secondary | ICD-10-CM | POA: Insufficient documentation

## 2017-06-05 NOTE — Telephone Encounter (Signed)
Please call pt: - his kidney and liver function are normal. His calcium is mildly elevated 10.6 (normal 10.5), however this was 9.9 last month. Elevated calcium can be caused by many reasons, however it can elevate with a sarcoid flare as well (which he has sarcoidosis). I would recommend he drink at least 80 ounces of water a day to halp decrease calcium level. Take the medications prescribed at visit for muscle strain and follow up in 2-3 weeks with repeat lab. If muscle ache not resolved at that time would want to move forward with image.

## 2017-06-08 NOTE — Telephone Encounter (Signed)
left detailed message with results and instructions on patient voice mail per [facility]

## 2017-06-09 NOTE — Telephone Encounter (Signed)
error 

## 2017-06-24 ENCOUNTER — Encounter: Payer: Self-pay | Admitting: Family Medicine

## 2017-06-24 ENCOUNTER — Ambulatory Visit (INDEPENDENT_AMBULATORY_CARE_PROVIDER_SITE_OTHER): Payer: 59 | Admitting: Family Medicine

## 2017-06-24 VITALS — BP 124/82 | HR 70 | Temp 98.2°F | Resp 20 | Ht 65.0 in | Wt 156.5 lb

## 2017-06-24 DIAGNOSIS — Z125 Encounter for screening for malignant neoplasm of prostate: Secondary | ICD-10-CM

## 2017-06-24 DIAGNOSIS — Z0001 Encounter for general adult medical examination with abnormal findings: Secondary | ICD-10-CM

## 2017-06-24 DIAGNOSIS — Z1159 Encounter for screening for other viral diseases: Secondary | ICD-10-CM | POA: Diagnosis not present

## 2017-06-24 DIAGNOSIS — M545 Low back pain, unspecified: Secondary | ICD-10-CM

## 2017-06-24 DIAGNOSIS — Z114 Encounter for screening for human immunodeficiency virus [HIV]: Secondary | ICD-10-CM

## 2017-06-24 DIAGNOSIS — E663 Overweight: Secondary | ICD-10-CM

## 2017-06-24 LAB — LIPID PANEL
Cholesterol: 227 mg/dL — ABNORMAL HIGH (ref 0–200)
HDL: 70.8 mg/dL (ref >39.00)
LDL CALC: 141 mg/dL — AB (ref 0–99)
NONHDL: 155.75
Total CHOL/HDL Ratio: 3
Triglycerides: 76 mg/dL (ref 0.0–149.0)
VLDL: 15.2 mg/dL (ref 0.0–40.0)

## 2017-06-24 LAB — PSA: PSA: 0.79 ng/mL (ref 0.10–4.00)

## 2017-06-24 LAB — HEMOGLOBIN A1C: Hgb A1c MFr Bld: 5.3 % (ref 4.6–6.5)

## 2017-06-24 NOTE — Progress Notes (Signed)
Patient ID: Christian Watson, male  DOB: 20-Jun-1961, 56 y.o.   MRN: [id number] Patient Care Team    Relationship Specialty Notifications Start End  [name], DO PCP - General Family Medicine  06/03/17   [name], MD Consulting Physician Pulmonary Disease  06/24/17     Chief Complaint  Patient presents with  . Annual Exam    Subjective:  Christian Watson is a 56 y.o. male present for CPE. All past medical history, surgical history, allergies, family history, immunizations, medications and social history were updated in the electronic medical record today. All recent labs, ED visits and hospitalizations within the last year were reviewed.  Health maintenance:  Colonoscopy: last screen completed 2015 for discomfort, recommend follow up in years Immunizations:  tdap UTD 2016, influenza declined today Infectious disease screening: HIV and Hep C completed today. PSA: Completed today  Assistive device: None Oxygen use:  Patient has a Dental home. Hospitalizations/ED visits: reviewed  Depression screen [facility] 2/9 06/24/2017 06/03/2017 04/03/2015  Decreased Interest 0 0 0  Down, Depressed, Hopeless 0 0 0  PHQ - 2 Score 0 0 0   No flowsheet data found.   Current Exercise Habits: Structured exercise class, Type of exercise: strength training/weights, Time (Minutes): 60, Frequency (Times/Week): 4, Weekly Exercise (Minutes/Week): 240, Intensity: Moderate Exercise limited by: None identified Fall Risk  06/24/2017  Falls in the past year? No    Immunization History  Administered Date(s) Administered  . Tdap 04/03/2015   Past Medical History:  Diagnosis Date  . Chicken pox   . Sarcoidosis    No Known Allergies Past Surgical History:  Procedure Laterality Date  . BRONCHOSCOPY     Bronchial Wash & Transbronchial Biopsies RML & RUL  . CARPAL TUNNEL RELEASE Left   . COLONOSCOPY    . cyst removed  as a child   from chin   Family History  Problem Relation  Age of Onset  . Heart disease Maternal Grandmother   . Heart disease Maternal Grandfather   . Cancer Maternal Grandfather        unsure what kind  . Heart disease Paternal Grandmother   . Cancer Paternal Grandmother        unsure what kind  . Heart disease Paternal Grandfather   . Heart disease Mother   . Hypertension Father   . Diabetes Father   . Hearing loss Father   . Diabetes Sister   . Lung disease Neg Hx   . Sarcoidosis Neg Hx   . Rheumatologic disease Neg Hx    Social History   Socioeconomic History  . Marital status: Married    Spouse name: Not on file  . Number of children: Y  . Years of education: Not on file  . Highest education level: Not on file  Social Needs  . Financial resource strain: Not on file  . Food insecurity - worry: Not on file  . Food insecurity - inability: Not on file  . Transportation needs - medical: Not on file  . Transportation needs - non-medical: Not on file  Occupational History  . Occupation: Occupational hygienist: [employer]  Tobacco Use  . Smoking status: Never Smoker  . Smokeless tobacco: Never Used  . Tobacco comment: Second-hand from parents   Substance and Sexual Activity  . Alcohol use: Yes    Comment: glass of wine with dinner  . Drug use: No  . Sexual activity: Yes  Partners: Female    Comment: Married  Other Topics Concern  . Not on file  Social History Narrative   Originally from [place]. Has lived in [place], [place], & moved to [place] in 2005. 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 Has prior international travel to [place], [place], [place], [place], [place], [place], & [place]. Has a dog currently. No bird, mold, or hot tub exposure. Enjoys playing golf.    Married. College educated. Business owner.   Exercises routinely.   Takes a multivitamin, drinks caffeine, uses herbal remedies.   Occasional alcohol.   Wears a bicycle  helmet. Has a smoke alarm in the home. Wears his seatbelt.   Safe in his relationships.   Allergies as of 06/24/2017   No Known Allergies     Medication List        Accurate as of 06/24/17 10:20 AM. Always use your most recent med list.          cyclobenzaprine 5 MG tablet Commonly known as:  FLEXERIL Take 1 tablet (5 mg total) 2 (two) times daily as needed by mouth for muscle spasms.   naproxen 500 MG tablet Commonly known as:  NAPROSYN Take 1 tablet (500 mg total) 2 (two) times daily with a meal by mouth.      All past medical history, surgical history, allergies, family history, immunizations andmedications were updated in the EMR today and reviewed under the history and medication portions of their EMR.     Recent Results (from the past 2160 hour(s))  Comp Met (CMET)     Status: Abnormal   Collection Time: 05/13/17  9:51 AM  Result Value Ref Range   Sodium 141 135 - 145 mEq/L   Potassium 4.4 3.5 - 5.1 mEq/L   Chloride 103 96 - 112 mEq/L   CO2 27 19 - 32 mEq/L   Glucose, Bld 103 (H) 70 - 99 mg/dL   BUN 14 6 - 23 mg/dL   Creatinine, Ser 0.91 0.40 - 1.50 mg/dL   Total Bilirubin 0.5 0.2 - 1.2 mg/dL   Alkaline Phosphatase 48 39 - 117 U/L   AST 28 0 - 37 U/L   ALT 24 0 - 53 U/L   Total Protein 7.2 6.0 - 8.3 g/dL   Albumin 4.4 3.5 - 5.2 g/dL   Calcium 9.9 8.4 - 10.5 mg/dL   GFR 92.19 >60.00 mL/min  CBC w/Diff     Status: Abnormal   Collection Time: 05/13/17  9:51 AM  Result Value Ref Range   WBC 3.2 (L) 4.0 - 10.5 K/uL   RBC 4.22 4.22 - 5.81 Mil/uL   Hemoglobin 13.6 13.0 - 17.0 g/dL   HCT 40.1 39.0 - 52.0 %   MCV 94.8 78.0 - 100.0 fl   MCHC 34.0 30.0 - 36.0 g/dL   RDW 13.5 11.5 - 15.5 %   Platelets 195.0 150.0 - 400.0 K/uL   Neutrophils Relative % 67.6 43.0 - 77.0 %   Lymphocytes Relative 22.0 12.0 - 46.0 %   Monocytes Relative 8.5 3.0 - 12.0 %   Eosinophils Relative 1.5 0.0 - 5.0 %   Basophils Relative 0.4 0.0 - 3.0 %   Neutro Abs 2.1 1.4 - 7.7 K/uL    Lymphs Abs 0.7 0.7 - 4.0 K/uL   Monocytes Absolute 0.3 0.1 - 1.0 K/uL   Eosinophils Absolute 0.0 0.0 - 0.7 K/uL   Basophils Absolute 0.0 0.0 - 0.1  K/uL  Angiotensin converting enzyme     Status: None   Collection Time: 05/13/17  9:51 AM  Result Value Ref Range   Angiotensin-Converting Enzyme 32 9 - 67 U/L  Comp Met (CMET)     Status: Abnormal   Collection Time: 06/03/17  4:08 PM  Result Value Ref Range   Sodium 140 135 - 145 mEq/L   Potassium 3.7 3.5 - 5.1 mEq/L   Chloride 100 96 - 112 mEq/L   CO2 31 19 - 32 mEq/L   Glucose, Bld 120 (H) 70 - 99 mg/dL   BUN 18 6 - 23 mg/dL   Creatinine, Ser 1.13 0.40 - 1.50 mg/dL   Total Bilirubin 0.8 0.2 - 1.2 mg/dL   Alkaline Phosphatase 53 39 - 117 U/L   AST 23 0 - 37 U/L   ALT 21 0 - 53 U/L   Total Protein 7.7 6.0 - 8.3 g/dL   Albumin 4.9 3.5 - 5.2 g/dL   Calcium 10.6 (H) 8.4 - 10.5 mg/dL   GFR 71.79 >60.00 mL/min    Dg Chest 2 View  Result Date: 05/13/2017 CLINICAL DATA:  History of sarcoidosis.  No current complaints. EXAM: CHEST  2 VIEW COMPARISON:  Chest x-ray of November 20, 2014 FINDINGS: The lungs are adequately inflated. There is no focal infiltrate. There is no pleural effusion. The heart and pulmonary vascularity are normal. The mediastinum is normal in width. The bony thorax exhibits no acute abnormality. There is stable partial compression of the body of T7. There is degenerative disc disease of the lower lumbar spine. IMPRESSION: There is no active cardiopulmonary disease. Electronically Signed   By: David  Martinique M.D.   On: 05/13/2017 09:52     ROS: 14 pt review of systems performed and negative (unless mentioned in an HPI)  Objective: BP 124/82 (BP Location: Left Arm, Patient Position: Sitting, Cuff Size: Large)   Pulse 70   Temp 98.2 F (36.8 C)   Resp 20   Ht _0  (1.651 m)   Wt 156 lb 8 oz (71 kg)   SpO2 100%   BMI 26.04 kg/m  Gen: Afebrile. No acute distress. Nontoxic in appearance, well-developed, well-nourished,   pleasant Caucasian male. HENT: AT. [place]. Bilateral TM visualized and normal in appearance, normal external auditory canal. MMM, no oral lesions, adequate dentition. Bilateral nares within normal limits. Throat without erythema, ulcerations or exudates. No Cough on exam, no hoarseness on exam. Eyes:Pupils Equal Round Reactive to light, Extraocular movements intact,  Conjunctiva without redness, discharge or icterus. Neck/lymp/endocrine: Supple, no lymphadenopathy, no thyromegaly CV: RRR no murmur, no edema, +2/4 P posterior tibialis pulses. No carotid bruits. No JVD. Chest: CTAB, no wheeze, rhonchi or crackles. Normal Respiratory effort. Good Air movement. Abd: Soft. Flat. NTND. BS present. No Masses palpated. No hepatosplenomegaly. No rebound tenderness or guarding. Skin: No rashes, purpura or petechiae. Warm and well-perfused. Skin intact. Neuro/Msk:  Normal gait. PERLA. EOMi. Alert. Oriented x3.  Cranial nerves II through XII intact. Muscle strength 5/5 upper/lower extremity. DTRs equal bilaterally.   - Right sided lumbar paraspinal fullness remains.  Psych: Normal affect, dress and demeanor. Normal speech. Normal thought content and judgment.  No exam data present  Assessment/plan: KEYANTE DURIO is a 56 y.o. male present for CPE Screening for prostate cancer - No urinary changes - PSA Encounter for screening for HIV/hepatitis C testing -Patient agreeable to testing today - HIV antibody (with reflex) - Hepatitis C Antibody Overweight (BMI 25.0-29.9) - Continue diet and  exercise - HgB A1c - Lipid panel Acute right-sided low back pain without sciatica - Area of concern is rather unchanged over the last 4 weeks. It is now failed conservative therapy for greater than 8 weeks with NSAIDs, muscle relaxers, heat etc. movement does reproduce symptoms, therefore this is likely muscle skeletal in nature, although mildly odd presentation with palpation. Discussed options with him today which  included referral to Dr. Gardenia Phlegm for OMT and focused ultrasound over area of concern if he felt appropriate. Patient is agreeable to referral today. - Ambulatory referral to Sports Medicine  Encounter for general adult medical examination with abnormal findings Patient was encouraged to exercise greater than 150 minutes a week. Patient was encouraged to choose a diet filled with fresh fruits and vegetables, and lean meats. AVS provided to patient today for education/recommendation on gender specific health and safety maintenance. Colonoscopy: last screen completed 2015 for discomfort, recommend follow up in years Immunizations:  tdap UTD 2016, influenza declined today Infectious disease screening: HIV and Hep C completed today. PSA: Completed today     Return in about 1 year (around 06/24/2018) for CPE.  Note is dictated utilizing voice recognition software. Although note has been proof read prior to signing, occasional typographical errors still can be missed. If any questions arise, please do not hesitate to call for verification.  Electronically signed by: Howard Pouch, DO [place]

## 2017-06-24 NOTE — Patient Instructions (Signed)

## 2017-06-25 ENCOUNTER — Telehealth: Payer: Self-pay | Admitting: Family Medicine

## 2017-06-25 LAB — HEPATITIS C ANTIBODY
HEP C AB: NONREACTIVE
SIGNAL TO CUT-OFF: 0.01 (ref <1.00)

## 2017-06-25 LAB — HIV ANTIBODY (ROUTINE TESTING W REFLEX): HIV: NONREACTIVE

## 2017-06-25 NOTE — Telephone Encounter (Signed)
Please inform pt his labs are good.  His LDL, which is his bad cholesterol is borderline.   It is 141.  The closer to 130 the better.  Diet low in saturated fats, higher fiber and exercise will help lower.  He could also try a fish oil supplement.

## 2017-07-07 NOTE — Progress Notes (Signed)
Christian Watson D.O. [facility] [address] [place], [place] [address] Phone: [phone] Subjective:    I'm seeing this patient by the request  of:  Kuneff, Renee A, DO   CC: Right-sided low back pain  BJY:[id number]:Subjective  Christian Watson is a 56 y.o. male coming in with complaint of right-sided low back pain.  Describes it as a dull throbbing aching sensation with sometimes a burning sensation.  Past medical history significant for sarcoidosis.  Patient did see primary care provider gave her anti-inflammatories as well as a muscle relaxer.  Found to have mild elevation of calcium at 10.6.  Onset- Month ago Duration- All day Character-dull, throbbing aching pain Patient states that it seems to get worse with movement.  Seems to get better with rest.  Denies any association with food. Severity-5/10     Past Medical History:  Diagnosis Date  . Chicken pox   . Sarcoidosis    Past Surgical History:  Procedure Laterality Date  . BRONCHOSCOPY     Bronchial Wash & Transbronchial Biopsies RML & RUL  . CARPAL TUNNEL RELEASE Left   . COLONOSCOPY    . cyst removed  as a child   from chin   Social History   Socioeconomic History  . Marital status: Married    Spouse name: None  . Number of children: Y  . Years of education: None  . Highest education level: None  Social Needs  . Financial resource strain: None  . Food insecurity - worry: None  . Food insecurity - inability: None  . Transportation needs - medical: None  . Transportation needs - non-medical: None  Occupational History  . Occupation: Pharmacologistbusiness owner    Employer: [employer]  Tobacco Use  . Smoking status: Never Smoker  . Smokeless tobacco: Never Used  . Tobacco comment: Second-hand from parents   Substance and Sexual Activity  . Alcohol use: Yes    Comment: glass of wine with dinner  . Drug use: No  . Sexual activity: Yes    Partners: Female    Comment: Married  Other Topics Concern  . None    Social History Narrative   Originally from [place]. Has lived in [place], [place], & moved to [place] in 2005. 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 Has prior international travel to [place], [place], [place], [place], [place], [place], & [place]. Has a dog currently. No bird, mold, or hot tub exposure. Enjoys playing golf.    Married. College educated. Business owner.   Exercises routinely.   Takes a multivitamin, drinks caffeine, uses herbal remedies.   Occasional alcohol.   Wears a bicycle helmet. Has a smoke alarm in the home. Wears his seatbelt.   Safe in his relationships.   No Known Allergies Family History  Problem Relation Age of Onset  . Heart disease Maternal Grandmother   . Heart disease Maternal Grandfather   . Cancer Maternal Grandfather        unsure what kind  . Heart disease Paternal Grandmother   . Cancer Paternal Grandmother        unsure what kind  . Heart disease Paternal Grandfather   . Heart disease Mother   . Hypertension Father   . Diabetes Father   . Hearing loss Father   . Diabetes Sister   . Lung disease Neg Hx   . Sarcoidosis Neg Hx   . Rheumatologic disease  Neg Hx      Past medical history, social, surgical and family history all reviewed in electronic medical record.  No pertanent information unless stated regarding to the chief complaint.   Review of Systems:Review of systems updated and as accurate as of 07/08/17  No headache, visual changes, nausea, vomiting, diarrhea, constipation, dizziness, abdominal pain, skin rash, fevers, chills, night sweats, weight loss, swollen lymph nodes, body aches, joint swelling, muscle aches, chest pain, shortness of breath, mood changes.   Objective  Blood pressure (!) 142/78, pulse 70, height 5\' 6"  (1.676 m), weight 158 lb (71.7 kg), SpO2 96 %. Systems examined below as of 07/08/17   General: No apparent distress alert and  oriented x3 mood and affect normal, dressed appropriately.  HEENT: Pupils equal, extraocular movements intact  Respiratory: Patient's speak in full sentences and does not appear short of breath  Cardiovascular: No lower extremity edema, non tender, no erythema  Skin: Warm dry intact with no signs of infection or rash on extremities or on axial skeleton.  Abdomen: Soft nontender  Neuro: Cranial nerves II through XII are intact, neurovascularly intact in all extremities with 2+ DTRs and 2+ pulses.  Lymph: No lymphadenopathy of posterior or anterior cervical chain or axillae bilaterally.  Gait normal with good balance and coordination.  MSK:  Non tender with full range of motion and good stability and symmetric strength and tone of shoulders, elbows, wrist,  knee and ankles bilaterally.  Right hip exam shows the patient is having no internal range of motion whatsoever.  Mild discomfort in the groin.  Mild positive Pearlean BrownieFaber with pain in the posterior aspect of the hip.  Negative straight leg test but patient states that the pain seems to be worse again with the straight leg test.  Patient does have some mild tenderness to palpation in the right lower lumbar spine.  Patient does have mild limitation in all planes of the back.  Patient only has 5-10 degrees of extension.  No weakness in the lower extremity's.  Gait seems to be unremarkable.  [id number]; 15 additional minutes spent for Therapeutic exercises as stated in above notes.  This included exercises focusing on stretching, strengthening, with significant focus on eccentric aspects.   Long term goals include an improvement in range of motion, strength, endurance as well as avoiding reinjury. Patient's frequency would include in 1-2 times a day, 3-5 times a week for a duration of 6-12 weeks. Low back exercises that included:  Pelvic tilt/bracing instruction to focus on control of the pelvic girdle and lower abdominal muscles  Glute strengthening exercises,  focusing on proper firing of the glutes without engaging the low back muscles Proper stretching techniques for maximum relief for the hamstrings, hip flexors, low back and some rotation where tolerated  Proper technique shown and discussed handout in great detail with ATC.  All questions were discussed and answered.      Impression and Recommendations:     This case required medical decision making of moderate complexity.      Note: This dictation was prepared with Dragon dictation along with smaller phrase technology. Any transcriptional errors that result from this process are unintentional.

## 2017-07-08 ENCOUNTER — Ambulatory Visit (INDEPENDENT_AMBULATORY_CARE_PROVIDER_SITE_OTHER)
Admission: RE | Admit: 2017-07-08 | Discharge: 2017-07-08 | Disposition: A | Discharge status: Home / Self Care | Payer: 59 | Source: Ambulatory Visit | Attending: Family Medicine | Admitting: Family Medicine

## 2017-07-08 ENCOUNTER — Encounter: Payer: Self-pay | Admitting: Family Medicine

## 2017-07-08 ENCOUNTER — Ambulatory Visit (INDEPENDENT_AMBULATORY_CARE_PROVIDER_SITE_OTHER): Payer: 59 | Admitting: Family Medicine

## 2017-07-08 VITALS — BP 142/78 | HR 70 | Ht 66.0 in | Wt 158.0 lb

## 2017-07-08 DIAGNOSIS — M545 Low back pain, unspecified: Secondary | ICD-10-CM

## 2017-07-08 DIAGNOSIS — M1611 Unilateral primary osteoarthritis, right hip: Secondary | ICD-10-CM | POA: Diagnosis not present

## 2017-07-08 NOTE — Assessment & Plan Note (Signed)
Low back pain.  Patient's pain does not seem to be radicular but patient does have increasing tightness with straight leg test.  Flank does not show any masses.  Patient does have significant limitation in internal range of motion of the hip though.  Concerned that some of this could be secondary to hip arthritis.  Back x-rays also ordered to rule out any degenerative disc that could be contributing to some of the discomfort and pain as well.  Patient wanted to try different medications and given home exercises, over-the-counter medications suggested, we discussed that low dose of vitamin D and will avoid high doses secondary to patient's history of sarcoidosis.  We discussed with patient that we may need to consider other medications if he continued to have difficulty.  Also may consider further imaging.  Patient will follow-up again in 3-4 weeks

## 2017-07-08 NOTE — Patient Instructions (Signed)
Good to see you  Gustavus Bryantce is your friend. Ice 20 minutes 2 times daily. Usually after activity and before bed. Exercises 3 times a week.  Vitamin D 1000 IU daily  Tart cherry extract 1200mg  at night Maybe boost turmeric to 500mg  twice daily   Xrays downstairs today to see how much arthritis could be playing a role.  See me again in 3-4 weeks to make sure we are making progress.

## 2017-08-17 NOTE — Progress Notes (Signed)
Tawana ScaleZach Yuliet Needs D[facility] [address] [place], [place] [address] Phone: [phone] Subjective:      CC: Back pain follow-up  AOZ:[id number]:Subjective  Christian Watson is a 57 y.o. male coming in with complaint of neck and leg pain.  Patient was found to have mild to moderate osteoarthritic changes diagnosed on x-rays that were independently visualized by me.  Also found to have what appeared to be a bone spur causing impingement of the hip.  Pain seemed to be more of a flank pain than anything else.  We discussed over-the-counter medications.  Discussed icing regimen and home exercises.  Patient states that his back is doing a little bit better. He said that he still has discomfort which is constant.         Past Medical History:  Diagnosis Date  . Chicken pox   . Sarcoidosis    Past Surgical History:  Procedure Laterality Date  . BRONCHOSCOPY     Bronchial Wash & Transbronchial Biopsies RML & RUL  . CARPAL TUNNEL RELEASE Left   . COLONOSCOPY    . cyst removed  as a child   from chin   Social History   Socioeconomic History  . Marital status: Married    Spouse name: None  . Number of children: Y  . Years of education: None  . Highest education level: None  Social Needs  . Financial resource strain: None  . Food insecurity - worry: None  . Food insecurity - inability: None  . Transportation needs - medical: None  . Transportation needs - non-medical: None  Occupational History  . Occupation: Pharmacologistbusiness owner    Employer: [employer]  Tobacco Use  . Smoking status: Never Smoker  . Smokeless tobacco: Never Used  . Tobacco comment: Second-hand from parents   Substance and Sexual Activity  . Alcohol use: Yes    Comment: glass of wine with dinner  . Drug use: No  . Sexual activity: Yes    Partners: Female    Comment: Married  Other Topics Concern  . None  Social History Narrative   Originally [place] lived in [place], [place], & moved to [place] in 2005. 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 Has prior international travel to [place], [place], [place], [place], [place], [place], & [place]. Has a dog currently. No bird, mold, or hot tub exposure. Enjoys playing golf.    Married. College educated. Business owner.   Exercises routinely.   Takes a multivitamin, drinks caffeine, uses herbal remedies.   Occasional alcohol.   Wears a bicycle helmet. Has a smoke alarm in the home. Wears his seatbelt.   Safe in his relationships.   No Known Allergies Family History  Problem Relation Age of Onset  . Heart disease Maternal Grandmother   . Heart disease Maternal Grandfather   . Cancer Maternal Grandfather        unsure what kind  . Heart disease Paternal Grandmother   . Cancer Paternal Grandmother        unsure what kind  . Heart disease Paternal Grandfather   . Heart disease Mother   . Hypertension Father   . Diabetes Father   . Hearing loss Father   . Diabetes Sister   . Lung disease Neg Hx   . Sarcoidosis Neg Hx   . Rheumatologic disease Neg Hx      Past medical history,  social, surgical and family history all reviewed in electronic medical record.  No pertanent information unless stated regarding to the chief complaint.   Review of Systems:Review of systems updated and as accurate as of 08/18/17  No headache, visual changes, nausea, vomiting, diarrhea, constipation, dizziness, abdominal pain, skin rash, fevers, chills, night sweats, weight loss, swollen lymph nodes, body aches, joint swelling, chest pain, shortness of breath, mood changes.  Positive muscle aches  Objective  Blood pressure 102/66, pulse (!) 46, height 5\' 6"  (1.676 m), weight 160 lb (72.6 kg), SpO2 91 %. Systems examined below as of 08/18/17   General: No apparent distress alert and oriented x3 mood and affect normal, dressed appropriately.  HEENT: Pupils equal, extraocular  movements intact  Respiratory: Patient's speak in full sentences and does not appear short of breath  Cardiovascular: No lower extremity edema, non tender, no erythema  Skin: Warm dry intact with no signs of infection or rash on extremities or on axial skeleton.  Abdomen: Soft nontender  Neuro: Cranial nerves II through XII are intact, neurovascularly intact in all extremities with 2+ DTRs and 2+ pulses.  Lymph: No lymphadenopathy of posterior or anterior cervical chain or axillae bilaterally.  Gait normal with good balance and coordination.  MSK:  Non tender with full range of motion and good stability and symmetric strength and tone of shoulders, elbows, wrist, , knee and ankles bilaterally.  Continued significant limitation with internal rotation of the hip. Back Exam:  Inspection: Unremarkable  Motion: Flexion 35 deg, Extension 25 deg, Side Bending to 30 deg bilaterally,  Rotation to 45 deg bilaterally  SLR laying: Negative  XSLR laying: Negative  Palpable tenderness: Tender to palpation in the paraspinal musculature lumbar spine right greater than left. FABER: Tightness on the right. Sensory change: Gross sensation intact to all lumbar and sacral dermatomes.  Reflexes: 2+ at both patellar tendons, 2+ at achilles tendons, Babinski's downgoing.  Strength at foot  Plantar-flexion: 5/5 Dorsi-flexion: 5/5 Eversion: 5/5 Inversion: 5/5  Leg strength  Quad: 5/5 Hamstring: 5/5 Hip flexor: 5/5 Hip abductors: 5/5  Gait unremarkable.  Osteopathic findings T6 extended rotated and side bent left T9 extended rotated and side bent left L2 flexed rotated and side bent right Sacrum right on right    Impression and Recommendations:     This case required medical decision making of moderate complexity.      Note: This dictation was prepared with Dragon dictation along with smaller phrase technology. Any transcriptional errors that result from this process are unintentional.

## 2017-08-18 ENCOUNTER — Encounter: Payer: Self-pay | Admitting: Family Medicine

## 2017-08-18 ENCOUNTER — Ambulatory Visit: Payer: 59 | Admitting: Family Medicine

## 2017-08-18 DIAGNOSIS — M25851 Other specified joint disorders, right hip: Secondary | ICD-10-CM

## 2017-08-18 DIAGNOSIS — M999 Biomechanical lesion, unspecified: Secondary | ICD-10-CM | POA: Diagnosis not present

## 2017-08-18 DIAGNOSIS — M545 Low back pain, unspecified: Secondary | ICD-10-CM

## 2017-08-18 HISTORY — DX: Other specified joint disorders, right hip: M25.851

## 2017-08-18 NOTE — Assessment & Plan Note (Signed)
Continues to have discomfort.  Attempted osteopathic manipulation.  Do feel that patient's hip flexors are tight on the right secondary to patient's impingement of the hip.  We discussed which activities to do which wants to avoid.  Patient is in agreement with the plan.  Responded well to manipulation.  Follow-up again in 4 weeks

## 2017-08-18 NOTE — Patient Instructions (Signed)
Good to see you  Overall you are going to well  Ice is your friend.  Stay active.  Keep active See me again in 3-4 weeks

## 2017-08-18 NOTE — Assessment & Plan Note (Signed)
Decision today to treat with OMT was based on Physical Exam  After verbal consent patient was treated with HVLA, ME, FPR techniques in  thoracic, lumbar and sacral areas  Patient tolerated the procedure well with improvement in symptoms  Patient given exercises, stretches and lifestyle modifications  See medications in patient instructions if given  Patient will follow up in 4 weeks 

## 2017-08-18 NOTE — Assessment & Plan Note (Signed)
Bone spur will monitor

## 2017-09-09 NOTE — Progress Notes (Signed)
[facility] [address] [place], [place] [address] Phone: [phone] Subjective:    I'm seeing this patient by the request  of:    CC: Back pain follow-up  [id number]  Christian Watson is a 57 y.o. male coming in with complaint of back pain.  Patient does have x-rays that were independently visualized by me showing moderate osteoarthritic changes.  Patient did also have a bone spur of the hip causing more of an impingement.  Started osteopathic manipulation.  Patient states that he does note some improvement. He still does have intermittent pain.  The severity is not quite as severe low.     Past Medical History:  Diagnosis Date  . Chicken pox   . Sarcoidosis    Past Surgical History:  Procedure Laterality Date  . BRONCHOSCOPY     Bronchial Wash & Transbronchial Biopsies RML & RUL  . CARPAL TUNNEL RELEASE Left   . COLONOSCOPY    . cyst removed  as a child   from chin   Social History   Socioeconomic History  . Marital status: Married    Spouse name: None  . Number of children: Y  . Years of education: None  . Highest education level: None  Social Needs  . Financial resource strain: None  . Food insecurity - worry: None  . Food insecurity - inability: None  . Transportation needs - medical: None  . Transportation needs - non-medical: None  Occupational History  . Occupation: Pharmacologist: [employer]  Tobacco Use  . Smoking status: Never Smoker  . Smokeless tobacco: Never Used  . Tobacco comment: Second-hand from parents   Substance and Sexual Activity  . Alcohol use: Yes    Comment: glass of wine with dinner  . Drug use: No  . Sexual activity: Yes    Partners: Female    Comment: Married  Other Topics Concern  . None  Social History Narrative   Originally from [place]. Has lived in [place], [place], & moved to [place] in 2005. 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 Has prior international travel to [place], [place], [place], [place], [place], [place], & [place]. Has a dog currently. No bird, mold, or hot tub exposure. Enjoys playing golf.    Married. College educated. Business owner.   Exercises routinely.   Takes a multivitamin, drinks caffeine, uses herbal remedies.   Occasional alcohol.   Wears a bicycle helmet. Has a smoke alarm in the home. Wears his seatbelt.   Safe in his relationships.   No Known Allergies Family History  Problem Relation Age of Onset  . Heart disease Maternal Grandmother   . Heart disease Maternal Grandfather   . Cancer Maternal Grandfather        unsure what kind  . Heart disease Paternal Grandmother   . Cancer Paternal Grandmother        unsure what kind  . Heart disease Paternal Grandfather   . Heart disease Mother   . Hypertension Father   . Diabetes Father   . Hearing loss Father   . Diabetes Sister   . Lung disease Neg Hx   . Sarcoidosis Neg Hx   . Rheumatologic disease Neg Hx      Past medical history, social, surgical and family history all reviewed in electronic medical record.  No pertanent information unless stated regarding to the chief  complaint.   Review of Systems:Review of systems updated and as accurate as of 09/10/17  No headache, visual changes, nausea, vomiting, diarrhea, constipation, dizziness, abdominal pain, skin rash, fevers, chills, night sweats, weight loss, swollen lymph nodes, body aches, joint swelling,  chest pain, shortness of breath, mood changes.  Positive muscle aches  Objective  Blood pressure 116/66, pulse (!) 55, height 5\' 6"  (1.676 m), weight 157 lb (71.2 kg), SpO2 98 %. Systems examined below as of 09/10/17   General: No apparent distress alert and oriented x3 mood and affect normal, dressed appropriately.  HEENT: Pupils equal, extraocular movements intact  Respiratory: Patient's speak in full sentences and does not appear short of  breath  Cardiovascular: No lower extremity edema, non tender, no erythema  Skin: Warm dry intact with no signs of infection or rash on extremities or on axial skeleton.  Abdomen: Soft nontender  Neuro: Cranial nerves II through XII are intact, neurovascularly intact in all extremities with 2+ DTRs and 2+ pulses.  Lymph: No lymphadenopathy of posterior or anterior cervical chain or axillae bilaterally.  Gait normal with good balance and coordination.  MSK:  Non tender with full range of motion and good stability and symmetric strength and tone of shoulders, elbows, wrist, knee and ankles bilaterally.  Neck: Inspection mild loss of lordosis. No palpable stepoffs. Negative Spurling's maneuver. Mild decreased range of motion with left-sided rotation Grip strength and sensation normal in bilateral hands Strength good C4 to T1 distribution No sensory change to C4 to T1 Negative Hoffman sign bilaterally Reflexes normal  Back exam still shows some tenderness to palpation over the right sacroiliac joint.  Some tightness of the paraspinal musculature lumbar spine.  Positive Faber on the right.  Still decreased range of motion internal range of motion of the right hip  Osteopathic findings T5 extended rotated and side bent left L4 flexed rotated and side bent right Sacrum right on right     Impression and Recommendations:     This case required medical decision making of moderate complexity.      Note: This dictation was prepared with Dragon dictation along with smaller phrase technology. Any transcriptional errors that result from this process are unintentional.

## 2017-09-10 ENCOUNTER — Encounter: Payer: Self-pay | Admitting: Family Medicine

## 2017-09-10 ENCOUNTER — Ambulatory Visit: Payer: 59 | Admitting: Family Medicine

## 2017-09-10 VITALS — BP 116/66 | HR 55 | Ht 66.0 in | Wt 157.0 lb

## 2017-09-10 DIAGNOSIS — M999 Biomechanical lesion, unspecified: Secondary | ICD-10-CM

## 2017-09-10 DIAGNOSIS — M545 Low back pain, unspecified: Secondary | ICD-10-CM

## 2017-09-10 NOTE — Assessment & Plan Note (Signed)
Decision today to treat with OMT was based on Physical Exam  After verbal consent patient was treated with HVLA, ME, FPR techniques in  thoracic, lumbar and sacral areas  Patient tolerated the procedure well with improvement in symptoms  Patient given exercises, stretches and lifestyle modifications  See medications in patient instructions if given  Patient will follow up in 4 weeks 

## 2017-09-10 NOTE — Patient Instructions (Signed)
Great to see you  You are doing well  Stay active and continue to work the exercises Lets try 6 weeks.  [phone] if you need us sooner.

## 2017-09-10 NOTE — Assessment & Plan Note (Signed)
Stable overall.  Patient does have some aches and pains.  Could be some of the underlying difficulty.  Discussed icing regimen and home exercise.  Patient is going to try to increase activity still.  We discussed core.  Does have muscle relaxers if needed.  Follow-up again in 6-8 weeks

## 2017-10-13 NOTE — Progress Notes (Signed)
[facility] [address] [place], [place] [address] Phone: [phone] Subjective:     CC: Back pain  [id number]  Christian Watson is a 57 y.o. male coming in with complaint of back. Patient states that he had an increase in pain in the right scapula. He called last week as he was in a lot of pain but the pain has decreased. He felt like a golf ball was stuck underneath his scapula. He does complain of radiating pain down his arm.  States that the radiation seems to start after sitting for about 10 minutes.  When he gets up and starts doing activities seems to get better.  Denies any weakness, but does not wake him up at night.  Denies any fevers, chills, any abnormal weight loss.  Patient feels Augmentin is something that is coming from his back.      Past Medical History:  Diagnosis Date  . Chicken pox   . Sarcoidosis    Past Surgical History:  Procedure Laterality Date  . BRONCHOSCOPY     Bronchial Wash & Transbronchial Biopsies RML & RUL  . CARPAL TUNNEL RELEASE Left   . COLONOSCOPY    . cyst removed  as a child   from chin   Social History   Socioeconomic History  . Marital status: Married    Spouse name: None  . Number of children: Y  . Years of education: None  . Highest education level: None  Social Needs  . Financial resource strain: None  . Food insecurity - worry: None  . Food insecurity - inability: None  . Transportation needs - medical: None  . Transportation needs - non-medical: None  Occupational History  . Occupation: Pharmacologist: [employer]  Tobacco Use  . Smoking status: Never Smoker  . Smokeless tobacco: Never Used  . Tobacco comment: Second-hand from parents   Substance and Sexual Activity  . Alcohol use: Yes    Comment: glass of wine with dinner  . Drug use: No  . Sexual activity: Yes    Partners: Female    Comment: Married  Other Topics Concern  . None  Social History Narrative   Originally from [place]. Has lived in [place], [place], & moved to [place] in 2005. 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 Has prior international travel to [place], [place], [place], [place], [place], [place], & [place]. Has a dog currently. No bird, mold, or hot tub exposure. Enjoys playing golf.    Married. College educated. Business owner.   Exercises routinely.   Takes a multivitamin, drinks caffeine, uses herbal remedies.   Occasional alcohol.   Wears a bicycle helmet. Has a smoke alarm in the home. Wears his seatbelt.   Safe in his relationships.   No Known Allergies Family History  Problem Relation Age of Onset  . Heart disease Maternal Grandmother   . Heart disease Maternal Grandfather   . Cancer Maternal Grandfather        unsure what kind  . Heart disease Paternal Grandmother   . Cancer Paternal Grandmother        unsure what kind  . Heart disease Paternal Grandfather   . Heart disease Mother   . Hypertension Father   . Diabetes Father   . Hearing loss Father   . Diabetes Sister   . Lung disease Neg Hx   . Sarcoidosis  Neg Hx   . Rheumatologic disease Neg Hx      Past medical history, social, surgical and family history all reviewed in electronic medical record.  No pertanent information unless stated regarding to the chief complaint.   Review of Systems:Review of systems updated and as accurate as of 10/14/17  No headache, visual changes, nausea, vomiting, diarrhea, constipation, dizziness, abdominal pain, skin rash, fevers, chills, night sweats, weight loss, swollen lymph nodes, body aches, joint swelling, muscle aches, chest pain, shortness of breath, mood changes.   Objective  Blood pressure 128/82, pulse (!) 56, height 5\' 6"  (1.676 m), weight 161 lb (73 kg), SpO2 98 %. Systems examined below as of 10/14/17   General: No apparent distress alert and oriented x3 mood and affect normal,  dressed appropriately.  HEENT: Pupils equal, extraocular movements intact  Respiratory: Patient's speak in full sentences and does not appear short of breath  Cardiovascular: No lower extremity edema, non tender, no erythema  Skin: Warm dry intact with no signs of infection or rash on extremities or on axial skeleton.  Abdomen: Soft nontender  Neuro: Cranial nerves II through XII are intact, neurovascularly intact in all extremities with 2+ DTRs and 2+ pulses.  Lymph: No lymphadenopathy of posterior or anterior cervical chain or axillae bilaterally.  Gait normal with good balance and coordination.  MSK:  Non tender with full range of motion and good stability and symmetric strength and tone of shoulders, elbows, wrist, hip, knee and ankles bilaterally.  Neck: Inspection mild loss of lordosis. No palpable stepoffs. Negative Spurling's maneuver. Lacking last 5-10 degrees of sidebending rotation bilaterally. Grip strength and sensation normal in bilateral hands Strength good C4 to T1 distribution No sensory change to C4 to T1 Negative Hoffman sign bilaterally Reflexes normal  Osteopathic findings C2 flexed rotated and side bent right C4 flexed rotated and side bent left C6 flexed rotated and side bent left T3 extended rotated and side bent right inhaled third rib T9 extended rotated and side bent left L2 flexed rotated and side bent right Sacrum right on right     Impression and Recommendations:     This case required medical decision making of moderate complexity.      Note: This dictation was prepared with Dragon dictation along with smaller phrase technology. Any transcriptional errors that result from this process are unintentional.

## 2017-10-14 ENCOUNTER — Ambulatory Visit (INDEPENDENT_AMBULATORY_CARE_PROVIDER_SITE_OTHER)
Admission: RE | Admit: 2017-10-14 | Discharge: 2017-10-14 | Disposition: A | Discharge status: Home / Self Care | Payer: 59 | Source: Ambulatory Visit | Attending: Family Medicine | Admitting: Family Medicine

## 2017-10-14 ENCOUNTER — Encounter: Payer: Self-pay | Admitting: Family Medicine

## 2017-10-14 ENCOUNTER — Ambulatory Visit: Payer: 59 | Admitting: Family Medicine

## 2017-10-14 VITALS — BP 128/82 | HR 56 | Ht 66.0 in | Wt 161.0 lb

## 2017-10-14 DIAGNOSIS — M999 Biomechanical lesion, unspecified: Secondary | ICD-10-CM

## 2017-10-14 DIAGNOSIS — M542 Cervicalgia: Secondary | ICD-10-CM | POA: Diagnosis not present

## 2017-10-14 DIAGNOSIS — G8929 Other chronic pain: Secondary | ICD-10-CM

## 2017-10-14 DIAGNOSIS — M549 Dorsalgia, unspecified: Secondary | ICD-10-CM | POA: Diagnosis not present

## 2017-10-14 DIAGNOSIS — M5412 Radiculopathy, cervical region: Secondary | ICD-10-CM

## 2017-10-14 DIAGNOSIS — M47812 Spondylosis without myelopathy or radiculopathy, cervical region: Secondary | ICD-10-CM | POA: Diagnosis not present

## 2017-10-14 NOTE — Assessment & Plan Note (Signed)
Decision today to treat with OMT was based on Physical Exam  After verbal consent patient was treated with HVLA, ME, FPR techniques in cervical, thoracic, rib lumbar and sacral areas  Patient tolerated the procedure well with improvement in symptoms  Patient given exercises, stretches and lifestyle modifications  See medications in patient instructions if given  Patient will follow up in 4 weeks 

## 2017-10-14 NOTE — Assessment & Plan Note (Signed)
Seems to be more posture and ergonomics.  Discussed icing regimen and home exercises.  Which activities to do which wants to avoid.  Follow-up again in 4 weeks

## 2017-10-14 NOTE — Patient Instructions (Signed)
Good to see you  Gustavus Bryantce is your friend.  Exercises 3 times a week.   Monitor at eye level.  Tennis ball between shoulder blades with sitting a long time Xray of neck downstairs See me again in 4 weeks

## 2017-10-22 ENCOUNTER — Ambulatory Visit: Payer: 59 | Admitting: Family Medicine

## 2017-11-15 NOTE — Progress Notes (Signed)
[facility] [address] [place], [place] [address] Phone: [phone] Subjective:     CC: Neck and back pain  ONG:[id number]  Christian Watson is a 57 y.o. male coming in with complaint of neck and back pain.  Has been doing relatively well with osteopathic manipulation and home exercises.  Patient does have a past medical history significant for sarcoidosis.  Patient states that he does stretch which alleviates his back pain.   Patient was moving some items last week when he felt he pulled his shoulder. Pain is superior aspect of shoulder. He aggravated his arm doing yard work this weekend. He is having radiating symptoms going down into his left hand. There is a constant dull ache in patient's shoulder.       Past Medical History:  Diagnosis Date  . Chicken pox   . Sarcoidosis    Past Surgical History:  Procedure Laterality Date  . BRONCHOSCOPY     Bronchial Wash & Transbronchial Biopsies RML & RUL  . CARPAL TUNNEL RELEASE Left   . COLONOSCOPY    . cyst removed  as a child   from chin   Social History   Socioeconomic History  . Marital status: Married    Spouse name: Not on file  . Number of children: Y  . Years of education: Not on file  . Highest education level: Not on file  Occupational History  . Occupation: Pharmacologist: [employer]  Social Needs  . Financial resource strain: Not on file  . Food insecurity:    Worry: Not on file    Inability: Not on file  . Transportation needs:    Medical: Not on file    Non-medical: Not on file  Tobacco Use  . Smoking status: Never Smoker  . Smokeless tobacco: Never Used  . Tobacco comment: Second-hand from parents   Substance and Sexual Activity  . Alcohol use: Yes    Comment: glass of wine with dinner  . Drug use: No  . Sexual activity: Yes    Partners: Female    Comment: Married  Lifestyle  . Physical activity:    Days per week: Not on file    Minutes per  session: Not on file  . Stress: Not on file  Relationships  . Social connections:    Talks on phone: Not on file    Gets together: Not on file    Attends religious service: Not on file    Active member of club or organization: Not on file    Attends meetings of clubs or organizations: Not on file    Relationship status: Not on file  Other Topics Concern  . Not on file  Social History Narrative   Originally from [place]. Has lived in [place], [place], & moved to [place] in 2005. 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 Has prior international travel to [place], [place], [place], [place], [place], [place], & [place]. Has a dog currently. No bird, mold, or hot tub exposure. Enjoys playing golf.    Married. College educated. Business owner.   Exercises routinely.   Takes a multivitamin, drinks caffeine, uses herbal remedies.   Occasional alcohol.   Wears a bicycle helmet. Has a smoke alarm in the home. Wears his seatbelt.   Safe in his relationships.   No Known Allergies Family History  Problem Relation Age of Onset  .  Heart disease Maternal Grandmother   . Heart disease Maternal Grandfather   . Cancer Maternal Grandfather        unsure what kind  . Heart disease Paternal Grandmother   . Cancer Paternal Grandmother        unsure what kind  . Heart disease Paternal Grandfather   . Heart disease Mother   . Hypertension Father   . Diabetes Father   . Hearing loss Father   . Diabetes Sister   . Lung disease Neg Hx   . Sarcoidosis Neg Hx   . Rheumatologic disease Neg Hx      Past medical history, social, surgical and family history all reviewed in electronic medical record.  No pertanent information unless stated regarding to the chief complaint.   Review of Systems:Review of systems updated and as accurate as of 11/16/17  No headache, visual changes, nausea, vomiting, diarrhea, constipation, dizziness,  abdominal pain, skin rash, fevers, chills, night sweats, weight loss, swollen lymph nodes, body aches, joint swelling, muscle aches, chest pain, shortness of breath, mood changes.   Objective  Blood pressure 124/76, pulse 62, height 5\' 6"  (1.676 m), weight 158 lb (71.7 kg), SpO2 95 %. Systems examined below as of 11/16/17   General: No apparent distress alert and oriented x3 mood and affect normal, dressed appropriately.  HEENT: Pupils equal, extraocular movements intact  Respiratory: Patient's speak in full sentences and does not appear short of breath  Cardiovascular: No lower extremity edema, non tender, no erythema  Skin: Warm dry intact with no signs of infection or rash on extremities or on axial skeleton.  Abdomen: Soft nontender  Neuro: Cranial nerves II through XII are intact, neurovascularly intact in all extremities with 2+ DTRs and 2+ pulses.  Lymph: No lymphadenopathy of posterior or anterior cervical chain or axillae bilaterally.  Gait normal with good balance and coordination.  MSK:  Non tender with full range of motion and good stability and symmetric strength and tone of , elbows, wrist, hip, knee and ankles bilaterally.  Shoulder: left Inspection reveals no abnormalities, atrophy or asymmetry. Palpation is normal with no tenderness over AC joint or bicipital groove. ROM is full in all planes passively. Rotator cuff strength normal throughout. signs of impingement with positive Neer and Hawkin's tests, but negative empty can sign. Speeds and Yergason's tests normal. No labral pathology noted with negative Obrien's, negative clunk and good stability. Normal scapular function observed. No painful arc and no drop arm sign. No apprehension sign Contralateral shoulder unremarkable  Neck exam shows the patient does have some mild loss of lordosis.  Patient does have a negative Spurling's today.  Patient though does have pain at the terminal ends of rotation.  Patient does have  sustained symmetric strength in the upper extremities bilaterally.  MSK US performed of: left This study was ordered, performed, and interpreted by Terrilee FilesZach Sylver Vantassell D.O.  Shoulder:   Supraspinatus:  Appears normal on long and transverse views, Bursal bulge seen with shoulder abduction on impingement view. Infraspinatus:  Appears normal on long and transverse views. Significant increase in Doppler flow Subscapularis:  Appears normal on long and transverse views. Positive bursa Teres Minor:  Appears normal on long and transverse views. AC joint:  Capsule undistended, no geyser sign. Glenohumeral Joint:  Appears normal without effusion. Glenoid Labrum:  Intact without visualized tears. Biceps Tendon:  Appears normal on long and transverse views, no fraying of tendon, tendon located in intertubercular groove, no subluxation with shoulder internal or external rotation.  Impression: Subacromial bursitis  Osteopathic findings C2 flexed rotated and side bent right C4 flexed rotated and side bent left C7 flexed rotated and side bent left T3 extended rotated and side bent right inhaled third rib T6 extended rotated and side bent left L2 flexed rotated and side bent right Sacrum right on right     Impression and Recommendations:     This case required medical decision making of moderate complexity.      Note: This dictation was prepared with Dragon dictation along with smaller phrase technology. Any transcriptional errors that result from this process are unintentional.

## 2017-11-16 ENCOUNTER — Ambulatory Visit: Payer: Self-pay

## 2017-11-16 ENCOUNTER — Ambulatory Visit: Payer: 59 | Admitting: Family Medicine

## 2017-11-16 ENCOUNTER — Encounter: Payer: Self-pay | Admitting: Family Medicine

## 2017-11-16 VITALS — BP 124/76 | HR 62 | Ht 66.0 in | Wt 158.0 lb

## 2017-11-16 DIAGNOSIS — M999 Biomechanical lesion, unspecified: Secondary | ICD-10-CM

## 2017-11-16 DIAGNOSIS — M25512 Pain in left shoulder: Secondary | ICD-10-CM | POA: Diagnosis not present

## 2017-11-16 DIAGNOSIS — M7552 Bursitis of left shoulder: Secondary | ICD-10-CM

## 2017-11-16 DIAGNOSIS — M542 Cervicalgia: Secondary | ICD-10-CM | POA: Diagnosis not present

## 2017-11-16 DIAGNOSIS — M549 Dorsalgia, unspecified: Secondary | ICD-10-CM

## 2017-11-16 DIAGNOSIS — G8929 Other chronic pain: Secondary | ICD-10-CM

## 2017-11-16 HISTORY — DX: Bursitis of left shoulder: M75.52

## 2017-11-16 NOTE — Assessment & Plan Note (Signed)
Decision today to treat with OMT was based on Physical Exam  After verbal consent patient was treated with HVLA, ME, FPR techniques in cervical, thoracic, lumbar and sacral areas  Patient tolerated the procedure well with improvement in symptoms  Patient given exercises, stretches and lifestyle modifications  See medications in patient instructions if given  Patient will follow up in 6 weeks 

## 2017-11-16 NOTE — Assessment & Plan Note (Signed)
Discussed with patient.  More of a bruise with a reactive bursitis.  Patient encouraged to do exercises, icing regimen, discussed avoiding certain activities.  Patient will follow-up again in 6 weeks

## 2017-11-16 NOTE — Patient Instructions (Addendum)
Good to see you  Overall you are doing well  Ice is your friend.  Keep hands within peripheral vision  Neck is doing well  See me again in 6 weeks

## 2017-11-16 NOTE — Assessment & Plan Note (Signed)
Mild arthritic changes noted.  We discussed icing regimen and home exercises.  Discussed with him how this can potentially help.  Patient does have what seems to be a little bit of more bursitis.  Responds very well though to manipulation.  Follow-up again in 6 weeks

## 2017-12-28 NOTE — Progress Notes (Signed)
[facility] Scale[facility] Watson D.O. [facility] [address] [place], [place] [address] Phone: [phone] Subjective:     CC: Neck pain follow-up  ION:[id number]:Subjective  [name] is a 57 y.o. male coming in with complaint of neck pain.  Patient was found to have mild arthritic changes noted on x-ray there were independently visualized by me.  Has been working on Air cabin crewposture and ergonomics.  Patient states that overall still the same dull, throbbing aching sensation and pain.     Past Medical History:  Diagnosis Date  . Chicken pox   . Sarcoidosis    Past Surgical History:  Procedure Laterality Date  . BRONCHOSCOPY     Bronchial Wash & Transbronchial Biopsies RML & RUL  . CARPAL TUNNEL RELEASE Left   . COLONOSCOPY    . cyst removed  as a child   from chin   Social History   Socioeconomic History  . Marital status: Married    Spouse name: Not on file  . Number of children: Y  . Years of education: Not on file  . Highest education level: Not on file  Occupational History  . Occupation: Pharmacologistbusiness owner    Employer: [employer]  Social Needs  . Financial resource strain: Not on file  . Food insecurity:    Worry: Not on file    Inability: Not on file  . Transportation needs:    Medical: Not on file    Non-medical: Not on file  Tobacco Use  . Smoking status: Never Smoker  . Smokeless tobacco: Never Used  . Tobacco comment: Second-hand from parents   Substance and Sexual Activity  . Alcohol use: Yes    Comment: glass of wine with dinner  . Drug use: No  . Sexual activity: Yes    Partners: Female    Comment: Married  Lifestyle  . Physical activity:    Days per week: Not on file    Minutes per session: Not on file  . Stress: Not on file  Relationships  . Social connections:    Talks on phone: Not on file    Gets together: Not on file    Attends religious service: Not on file    Active member of club or organization: Not on file    Attends meetings of clubs or  organizations: Not on file    Relationship status: Not on file  Other Topics Concern  . Not on file  Social History Narrative   Originally from [place]. Has lived in [place], [place], & moved to [place] in 2005. 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 Has prior international travel to [place], [place], [place], [place], [place], & [place]. Has a dog currently. No bird, mold, or hot tub exposure. Enjoys playing golf.    Married. College educated. Business owner.   Exercises routinely.   Takes a multivitamin, drinks caffeine, uses herbal remedies.   Occasional alcohol.   Wears a bicycle helmet. Has a smoke alarm in the home. Wears his seatbelt.   Safe in his relationships.   No Known Allergies Family History  Problem Relation Age of Onset  . Heart disease Maternal Grandmother   . Heart disease Maternal Grandfather   . Cancer Maternal Grandfather        unsure what kind  . Heart disease Paternal Grandmother   . Cancer Paternal Grandmother        unsure what kind  .  Heart disease Paternal Grandfather   . Heart disease Mother   . Hypertension Father   . Diabetes Father   . Hearing loss Father   . Diabetes Sister   . Lung disease Neg Hx   . Sarcoidosis Neg Hx   . Rheumatologic disease Neg Hx      Past medical history, social, surgical and family history all reviewed in electronic medical record.  No pertanent information unless stated regarding to the chief complaint.   Review of Systems:Review of systems updated and as accurate as of 12/29/17  No headache, visual changes, nausea, vomiting, diarrhea, constipation, dizziness, abdominal pain, skin rash, fevers, chills, night sweats, weight loss, swollen lymph nodes, body aches, joint swelling, muscle aches, chest pain, shortness of breath, mood changes.   Objective  Blood pressure 116/76, pulse 61, height 5\' 6"  (1.676 m), weight 154 lb (69.9 kg),  SpO2 96 %. Systems examined below as of 12/29/17   General: No apparent distress alert and oriented x3 mood and affect normal, dressed appropriately.  HEENT: Pupils equal, extraocular movements intact  Respiratory: Patient's speak in full sentences and does not appear short of breath  Cardiovascular: No lower extremity edema, non tender, no erythema  Skin: Warm dry intact with no signs of infection or rash on extremities or on axial skeleton.  Abdomen: Soft nontender  Neuro: Cranial nerves II through XII are intact, neurovascularly intact in all extremities with 2+ DTRs and 2+ pulses.  Lymph: No lymphadenopathy of posterior or anterior cervical chain or axillae bilaterally.  Gait normal with good balance and coordination.  MSK:  Non tender with full range of motion and good stability and symmetric strength and tone of shoulders, elbows, wrist, hip, knee and ankles bilaterally.  Neck: Inspection loss of lordosis. No palpable stepoffs. Negative Spurling's maneuver. Full neck range of motion Grip strength and sensation normal in bilateral hands Strength good C4 to T1 distribution No sensory change to C4 to T1 Negative Hoffman sign bilaterally Reflexes normal Tightness in the left trapezius  Osteopathic findings C2 flexed rotated and side bent right C6 flexed rotated and side bent left T3 extended rotated and side bent right inhaled third rib L2 flexed rotated and side bent right Sacrum right on right      Impression and Recommendations:     This case required medical decision making of moderate complexity.      Note: This dictation was prepared with Dragon dictation along with smaller phrase technology. Any transcriptional errors that result from this process are unintentional.

## 2017-12-29 ENCOUNTER — Encounter: Payer: Self-pay | Admitting: Family Medicine

## 2017-12-29 ENCOUNTER — Ambulatory Visit: Payer: 59 | Admitting: Family Medicine

## 2017-12-29 VITALS — BP 116/76 | HR 61 | Ht 66.0 in | Wt 154.0 lb

## 2017-12-29 DIAGNOSIS — M999 Biomechanical lesion, unspecified: Secondary | ICD-10-CM | POA: Diagnosis not present

## 2017-12-29 DIAGNOSIS — G8929 Other chronic pain: Secondary | ICD-10-CM

## 2017-12-29 DIAGNOSIS — M542 Cervicalgia: Secondary | ICD-10-CM

## 2017-12-29 DIAGNOSIS — M549 Dorsalgia, unspecified: Secondary | ICD-10-CM

## 2017-12-29 NOTE — Assessment & Plan Note (Signed)
Mild arthritic changes.  Discussed icing regimen and home exercises.  Discussed which activities to do which wants to avoid.  Patient is to increase activity slowly over the course the next several days.  Follow-up with me again 4 to 8 weeks.

## 2017-12-29 NOTE — Patient Instructions (Signed)
Great to see you  Good luck with the kitchen See me again in 2months

## 2017-12-29 NOTE — Assessment & Plan Note (Signed)
Decision today to treat with OMT was based on Physical Exam  After verbal consent patient was treated with HVLA, ME, FPR techniques in cervical, thoracic, lumbar and sacral areas  Patient tolerated the procedure well with improvement in symptoms  Patient given exercises, stretches and lifestyle modifications  See medications in patient instructions if given  Patient will follow up in 4-8 weeks 

## 2018-01-08 ENCOUNTER — Encounter: Payer: Self-pay | Admitting: Family Medicine

## 2018-01-08 ENCOUNTER — Ambulatory Visit: Payer: 59 | Admitting: Family Medicine

## 2018-01-08 VITALS — BP 129/77 | HR 66 | Temp 98.6°F | Resp 20 | Ht 66.0 in | Wt 151.4 lb

## 2018-01-08 DIAGNOSIS — J0100 Acute maxillary sinusitis, unspecified: Secondary | ICD-10-CM

## 2018-01-08 MED ORDER — BENZONATATE 200 MG PO CAPS: 200.0000 mg | ORAL_CAPSULE | Freq: Two times a day (BID) | ORAL | 0 refills | Status: DC | PRN

## 2018-01-08 MED ORDER — PREDNISONE 20 MG PO TABS: ORAL_TABLET | ORAL | 0 refills | Status: DC

## 2018-01-08 MED ORDER — AMOXICILLIN-POT CLAVULANATE 875-125 MG PO TABS: 1.0000 | ORAL_TABLET | Freq: Two times a day (BID) | ORAL | 0 refills | Status: DC

## 2018-01-08 NOTE — Progress Notes (Signed)
Christian Watson , July 22, 1961, 57 y.o., male MRN: [id number] Patient Care Team    Relationship Specialty Notifications Start End  Natalia Leatherwood, DO PCP - General Family Medicine  06/03/17   Roslynn Amble, MD Consulting Physician Pulmonary Disease  06/24/17     Chief Complaint  Patient presents with  . URI    congestion,cough x 1 week     Subjective: Pt presents for an OV with complaints of cough and congestion of 1 week  duration.  Associated symptoms include fatigue, popping ears, sore throat, head congestion and increased phlegm production.    Depression screen [facility] 2/9 06/24/2017 06/03/2017 04/03/2015  Decreased Interest 0 0 0  Down, Depressed, Hopeless 0 0 0  PHQ - 2 Score 0 0 0    No Known Allergies Social History   Tobacco Use  . Smoking status: Never Smoker  . Smokeless tobacco: Never Used  . Tobacco comment: Second-hand from parents   Substance Use Topics  . Alcohol use: Yes    Comment: glass of wine with dinner   Past Medical History:  Diagnosis Date  . Chicken pox   . Sarcoidosis    Past Surgical History:  Procedure Laterality Date  . BRONCHOSCOPY     Bronchial Wash & Transbronchial Biopsies RML & RUL  . CARPAL TUNNEL RELEASE Left   . COLONOSCOPY    . cyst removed  as a child   from chin   Family History  Problem Relation Age of Onset  . Heart disease Maternal Grandmother   . Heart disease Maternal Grandfather   . Cancer Maternal Grandfather        unsure what kind  . Heart disease Paternal Grandmother   . Cancer Paternal Grandmother        unsure what kind  . Heart disease Paternal Grandfather   . Heart disease Mother   . Hypertension Father   . Diabetes Father   . Hearing loss Father   . Diabetes Sister   . Lung disease Neg Hx   . Sarcoidosis Neg Hx   . Rheumatologic disease Neg Hx    Allergies as of 01/08/2018   No Known Allergies     Medication List    as of 01/08/2018 12:58 PM   You have not been prescribed any  medications.     All past medical history, surgical history, allergies, family history, immunizations andmedications were updated in the EMR today and reviewed under the history and medication portions of their EMR.     ROS: Negative, with the exception of above mentioned in HPI   Objective:  BP 129/77 (BP Location: Right Arm, Patient Position: Sitting, Cuff Size: Normal)   Pulse 66   Temp 98.6 F (37 C)   Resp 20   Ht 5\' 6"  (1.676 m)   Wt 151 lb 6 oz (68.7 kg)   SpO2 98%   BMI 24.43 kg/m  Body mass index is 24.43 kg/m. Gen: Afebrile. No acute distress. Nontoxic in appearance, well developed, well nourished.  HENT: AT. [place]. Bilateral TM visualized without erythema or fullness.MMM, no oral lesions. Bilateral nares without erythema, mild drainage and swelling. Throat without erythema or exudates. PND present, cough present.  Eyes:Pupils Equal Round Reactive to light, Extraocular movements intact,  Conjunctiva without redness, discharge or icterus. Neck/lymp/endocrine: Supple,no lymphadenopathy CV: RRR no murmur, no edema Chest: CTAB, no wheeze or crackles. Good air movement, normal resp effort.  Abd: Soft. NTND. BS present.   Neuro:  Normal  gait. PERLA. EOMi. Alert. Oriented x3   No exam data present No results found. No results found for this or any previous visit (from the past 24 hour(s)).  Assessment/Plan: Christian Watson is a 57 y.o. male present for OV for  Acute non-recurrent maxillary sinusitis Rest, hydrate.  +/- flonase, mucinex (DM if cough), nettie pot or nasal saline.  Augmentin start  if progressing over the weekend, prescribed, take until completed.  Start prednisone daily and tessalon perles as needed for cough.  If cough present it can last up to 6-8 weeks.  F/U 2 weeks of not improved.    Reviewed expectations re: course of current medical issues.  Discussed self-management of symptoms.  Outlined signs and symptoms indicating need for more acute  intervention.  Patient verbalized understanding and all questions were answered.  Patient received an After-Visit Summary.    No orders of the defined types were placed in this encounter.    Note is dictated utilizing voice recognition software. Although note has been proof read prior to signing, occasional typographical errors still can be missed. If any questions arise, please do not hesitate to call for verification.   electronically signed by:  Felix Pacinienee Aleatha Taite, DO  [facility] [place]

## 2018-01-08 NOTE — Patient Instructions (Signed)
Rest, hydrate.  +/- flonase, mucinex (DM if cough), nettie pot or nasal saline.  Augmentin if progressing over the weekend, prescribed, take until completed.  Start prednisone daily and tessalon perles as needed for cough.  If cough present it can last up to 6-8 weeks.  F/U 2 weeks of not improved.    Sinusitis, Adult Sinusitis is soreness and inflammation of your sinuses. Sinuses are hollow spaces in the bones around your face. They are located:  Around your eyes.  In the middle of your forehead.  Behind your nose.  In your cheekbones.  Your sinuses and nasal passages are lined with a stringy fluid (mucus). Mucus normally drains out of your sinuses. When your nasal tissues get inflamed or swollen, the mucus can get trapped or blocked so air cannot flow through your sinuses. This lets bacteria, viruses, and funguses grow, and that leads to infection. Follow these instructions at home: Medicines  Take, use, or apply over-the-counter and prescription medicines only as told by your doctor. These may include nasal sprays.  If you were prescribed an antibiotic medicine, take it as told by your doctor. Do not stop taking the antibiotic even if you start to feel better. Hydrate and Humidify  Drink enough water to keep your pee (urine) clear or pale yellow.  Use a cool mist humidifier to keep the humidity level in your home above 50%.  Breathe in steam for 10-15 minutes, 3-4 times a day or as told by your doctor. You can do this in the bathroom while a hot shower is running.  Try not to spend time in cool or dry air. Rest  Rest as much as possible.  Sleep with your head raised (elevated).  Make sure to get enough sleep each night. General instructions  Put a warm, moist washcloth on your face 3-4 times a day or as told by your doctor. This will help with discomfort.  Wash your hands often with soap and water. If there is no soap and water, use hand sanitizer.  Do not smoke.  Avoid being around people who are smoking (secondhand smoke).  Keep all follow-up visits as told by your doctor. This is important. Contact a doctor if:  You have a fever.  Your symptoms get worse.  Your symptoms do not get better within 10 days. Get help right away if:  You have a very bad headache.  You cannot stop throwing up (vomiting).  You have pain or swelling around your face or eyes.  You have trouble seeing.  You feel confused.  Your neck is stiff.  You have trouble breathing. This information is not intended to replace advice given to you by your health care provider. Make sure you discuss any questions you have with your health care provider. Document Released: 12/31/2007 Document Revised: 03/09/2016 Document Reviewed: 05/09/2015 Elsevier Interactive Patient Education  [employer]2018 [employer]

## 2018-02-01 ENCOUNTER — Ambulatory Visit: Payer: 59 | Admitting: Family Medicine

## 2018-02-01 ENCOUNTER — Encounter: Payer: Self-pay | Admitting: Family Medicine

## 2018-02-01 VITALS — BP 136/79 | HR 73 | Temp 99.0°F | Resp 20 | Ht 66.0 in | Wt 152.6 lb

## 2018-02-01 DIAGNOSIS — R05 Cough: Secondary | ICD-10-CM

## 2018-02-01 DIAGNOSIS — J40 Bronchitis, not specified as acute or chronic: Secondary | ICD-10-CM | POA: Diagnosis not present

## 2018-02-01 DIAGNOSIS — R059 Cough, unspecified: Secondary | ICD-10-CM

## 2018-02-01 DIAGNOSIS — D869 Sarcoidosis, unspecified: Secondary | ICD-10-CM | POA: Diagnosis not present

## 2018-02-01 LAB — CBC WITH DIFFERENTIAL/PLATELET
BASOS ABS: 0 10*3/uL (ref 0.0–0.1)
Basophils Relative: 0.2 % (ref 0.0–3.0)
EOS PCT: 0.6 % (ref 0.0–5.0)
Eosinophils Absolute: 0 10*3/uL (ref 0.0–0.7)
HEMATOCRIT: 37.6 % — AB (ref 39.0–52.0)
Hemoglobin: 13.1 g/dL (ref 13.0–17.0)
LYMPHS ABS: 0.6 10*3/uL — AB (ref 0.7–4.0)
Lymphocytes Relative: 9.4 % — ABNORMAL LOW (ref 12.0–46.0)
MCHC: 34.9 g/dL (ref 30.0–36.0)
MCV: 92.2 fl (ref 78.0–100.0)
MONOS PCT: 5 % (ref 3.0–12.0)
Monocytes Absolute: 0.3 10*3/uL (ref 0.1–1.0)
NEUTROS PCT: 84.8 % — AB (ref 43.0–77.0)
Neutro Abs: 5.2 10*3/uL (ref 1.4–7.7)
Platelets: 217 10*3/uL (ref 150.0–400.0)
RBC: 4.08 Mil/uL — AB (ref 4.22–5.81)
RDW: 13.8 % (ref 11.5–15.5)
WBC: 6.1 10*3/uL (ref 4.0–10.5)

## 2018-02-01 LAB — COMPREHENSIVE METABOLIC PANEL
ALK PHOS: 69 U/L (ref 39–117)
ALT: 20 U/L (ref 0–53)
AST: 21 U/L (ref 0–37)
Albumin: 4.4 g/dL (ref 3.5–5.2)
BILIRUBIN TOTAL: 0.6 mg/dL (ref 0.2–1.2)
BUN: 12 mg/dL (ref 6–23)
CALCIUM: 9.9 mg/dL (ref 8.4–10.5)
CO2: 31 mEq/L (ref 19–32)
Chloride: 101 mEq/L (ref 96–112)
Creatinine, Ser: 0.99 mg/dL (ref 0.40–1.50)
GFR: 83.42 mL/min (ref >60.00)
Glucose, Bld: 108 mg/dL — ABNORMAL HIGH (ref 70–99)
POTASSIUM: 4.2 meq/L (ref 3.5–5.1)
Sodium: 141 mEq/L (ref 135–145)
TOTAL PROTEIN: 7 g/dL (ref 6.0–8.3)

## 2018-02-01 NOTE — Progress Notes (Signed)
Christian Watson , [birth year], 57 y.o., male MRN: [id number] Patient Care Team    Relationship Specialty Notifications Start End  [name], DO PCP - General Family Medicine  06/03/17   [name], MD Consulting Physician Pulmonary Disease  06/24/17     Chief Complaint  Patient presents with  . URI    congestion,cough     Subjective:  HRISTOPHER Watson is 57 y.o. male presents today for continued symptoms after treatment for sinus infection 3 weeks ago. He was treated with Augmentin and feels he has not recovered yet. He endorses cough, hoarseness, increased phlegm production and mild sore throat that is worse at night. He denies fever of fatigue.   Prior note: Pt presents for an OV with complaints of cough and congestion of 1 week  duration.  Associated symptoms include fatigue, popping ears, sore throat, head congestion and increased phlegm production.    Depression screen [facility] 2/9 02/05/2018 06/24/2017 06/03/2017 04/03/2015  Decreased Interest 0 0 0 0  Down, Depressed, Hopeless 0 0 0 0  PHQ - 2 Score 0 0 0 0    No Known Allergies Social History   Tobacco Use  . Smoking status: Never Smoker  . Smokeless tobacco: Never Used  . Tobacco comment: Second-hand from parents   Substance Use Topics  . Alcohol use: Yes    Comment: glass of wine with dinner   Past Medical History:  Diagnosis Date  . Chicken pox   . Sarcoidosis    Past Surgical History:  Procedure Laterality Date  . BRONCHOSCOPY     Bronchial Wash & Transbronchial Biopsies RML & RUL  . CARPAL TUNNEL RELEASE Left   . COLONOSCOPY    . cyst removed  as a child   from chin   Family History  Problem Relation Age of Onset  . Heart disease Maternal Grandmother   . Heart disease Maternal Grandfather   . Cancer Maternal Grandfather        unsure what kind  . Heart disease Paternal Grandmother   . Cancer Paternal Grandmother        unsure what kind  . Heart disease Paternal Grandfather   .  Heart disease Mother   . Hypertension Father   . Diabetes Father   . Hearing loss Father   . Diabetes Sister   . Lung disease Neg Hx   . Sarcoidosis Neg Hx   . Rheumatologic disease Neg Hx    Allergies as of 02/01/2018   No Known Allergies     Medication List    as of 02/01/2018 11:59 PM   You have not been prescribed any medications.     All past medical history, surgical history, allergies, family history, immunizations andmedications were updated in the EMR today and reviewed under the history and medication portions of their EMR.     ROS: Negative, with the exception of above mentioned in HPI   Objective:  BP 136/79 (BP Location: Right Arm, Patient Position: Sitting, Cuff Size: Normal)   Pulse 73   Temp 99 F (37.2 C)   Resp 20   Ht '5\' 6"'$  (1.676 m)   Wt 152 lb 9.6 oz (69.2 kg)   SpO2 97%   BMI 24.63 kg/m  Body mass index is 24.63 kg/m. Gen: Afebrile. No acute distress. Nontoxic in appearance, well developed, well nourished.  HENT: AT. [place]. Bilateral TM visualized and normal in appearance. MMM. Bilateral nares mild erythema and drainage, no swelling. Throat  without erythema or exudates. Cough and hoarseness present. No TTP sinus. Eyes:Pupils Equal Round Reactive to light, Extraocular movements intact,  Conjunctiva without redness, discharge or icterus. Neck/lymp/endocrine: Supple,no lymphadenopathy CV: RRR es Chest: CTAB, no wheeze or crackles Abd: Soft. NTND. BS present. no Masses palpated.  Skin: no rashes, purpura or petechiae.  Neuro:  Normal gait. PERLA. EOMi. Alert. Oriented.  Recent Results (from the past 2160 hour(s))  Comp Met (CMET)     Status: Abnormal   Collection Time: 02/01/18  1:57 PM  Result Value Ref Range   Sodium 141 135 - 145 mEq/L   Potassium 4.2 3.5 - 5.1 mEq/L   Chloride 101 96 - 112 mEq/L   CO2 31 19 - 32 mEq/L   Glucose, Bld 108 (H) 70 - 99 mg/dL   BUN 12 6 - 23 mg/dL   Creatinine, Ser 0.99 0.40 - 1.50 mg/dL   Total Bilirubin 0.6 0.2  - 1.2 mg/dL   Alkaline Phosphatase 69 39 - 117 U/L   AST 21 0 - 37 U/L   ALT 20 0 - 53 U/L   Total Protein 7.0 6.0 - 8.3 g/dL   Albumin 4.4 3.5 - 5.2 g/dL   Calcium 9.9 8.4 - 10.5 mg/dL   GFR 83.42 >60.00 mL/min  Angiotensin converting enzyme     Status: None   Collection Time: 02/01/18  1:57 PM  Result Value Ref Range   Angiotensin-Converting Enzyme 26 9 - 67 U/L  CBC w/Diff     Status: Abnormal   Collection Time: 02/01/18  1:57 PM  Result Value Ref Range   WBC 6.1 4.0 - 10.5 K/uL   RBC 4.08 (L) 4.22 - 5.81 Mil/uL   Hemoglobin 13.1 13.0 - 17.0 g/dL   HCT 37.6 (L) 39.0 - 52.0 %   MCV 92.2 78.0 - 100.0 fl   MCHC 34.9 30.0 - 36.0 g/dL   RDW 13.8 11.5 - 15.5 %   Platelets 217.0 150.0 - 400.0 K/uL   Neutrophils Relative % 84.8 (H) 43.0 - 77.0 %   Lymphocytes Relative 9.4 (L) 12.0 - 46.0 %   Monocytes Relative 5.0 3.0 - 12.0 %   Eosinophils Relative 0.6 0.0 - 5.0 %   Basophils Relative 0.2 0.0 - 3.0 %   Neutro Abs 5.2 1.4 - 7.7 K/uL   Lymphs Abs 0.6 (L) 0.7 - 4.0 K/uL   Monocytes Absolute 0.3 0.1 - 1.0 K/uL   Eosinophils Absolute 0.0 0.0 - 0.7 K/uL   Basophils Absolute 0.0 0.0 - 0.1 K/uL   Dg Chest 2 View  Result Date: 02/02/2018 CLINICAL DATA:  Productive cough for 2 weeks, history of sarcoidosis EXAM: CHEST - 2 VIEW COMPARISON:  Chest x-ray of 05/13/2017 FINDINGS: No active infiltrate or effusion is seen. Mediastinal and hilar contours are unremarkable. There is mild peribronchial thickening which can be seen with bronchitis. The heart is within normal limits in size. No acute bony abnormality is seen with mild degenerative change in the lower thoracic spine noted. IMPRESSION: 1. No active lung disease. 2. Mild peribronchial thickening can be seen with bronchitis. Electronically Signed   By: Ivar Drape M.D.   On: 02/02/2018 15:55   Assessment/Plan: Christian Watson is a 57 y.o. male present for OV for  Cough/bronchitis - His sinus symptoms have resolved, however he is  endorsing more bronchitis symptoms currently. CXR r/o PNA.  CBC.  - DG Chest 2 View; Future - CBC w/Diff - Z-pack. Rest. Hydrate. Mucinex DM.  - start daily  allergy med.   Sarcoidosis Has not had sarcoid/pulm followup in some time. R/o worsening sarcoid as potential source of cough.  - Angiotensin converting enzyme - CMET  F/U 2-4 weeks PRN   Reviewed expectations re: course of current medical issues.  Discussed self-management of symptoms.  Outlined signs and symptoms indicating need for more acute intervention.  Patient verbalized understanding and all questions were answered.  Patient received an After-Visit Summary.    Orders Placed This Encounter  Procedures  . DG Chest 2 View  . Comp Met (CMET)  . Angiotensin converting enzyme  . CBC w/Diff     Note is dictated utilizing voice recognition software. Although note has been proof read prior to signing, occasional typographical errors still can be missed. If any questions arise, please do not hesitate to call for verification.   electronically signed by:  Howard Pouch, DO  [place]

## 2018-02-01 NOTE — Patient Instructions (Signed)
We will get an xray of your lungs and call you with results.  Lab work collected today to check on white blood cell counts and sarcoid.   For now: - continue mucinex DM and start daily allergy medicine.  - we will call you with lab results once we receive them and discuss management at that time.

## 2018-02-02 ENCOUNTER — Telehealth: Payer: Self-pay | Admitting: Family Medicine

## 2018-02-02 ENCOUNTER — Ambulatory Visit (INDEPENDENT_AMBULATORY_CARE_PROVIDER_SITE_OTHER)
Admission: RE | Admit: 2018-02-02 | Discharge: 2018-02-02 | Disposition: A | Discharge status: Home / Self Care | Payer: 59 | Source: Ambulatory Visit | Attending: Family Medicine | Admitting: Family Medicine

## 2018-02-02 DIAGNOSIS — R05 Cough: Secondary | ICD-10-CM

## 2018-02-02 DIAGNOSIS — R059 Cough, unspecified: Secondary | ICD-10-CM

## 2018-02-02 MED ORDER — AZITHROMYCIN 250 MG PO TABS: ORAL_TABLET | ORAL | 0 refills | Status: DC

## 2018-02-02 NOTE — Telephone Encounter (Signed)
Spoke with patient reviewed xray results ,instructions and information. Patient verbalized understanding. 

## 2018-02-02 NOTE — Telephone Encounter (Signed)
Please inform patient the following information: His cxr appeared consistent with bronchitis-like changes. His labs show a possibly of mild bacteria infection. I have called in a z-pack to help cover. I am still waiting on the one lab for sarcoid (this takes a few days to result)--> we will call again once we receive this.  F/U in 2-4 weeks if cough remains, we would then need consider advanced image vs referral .

## 2018-02-03 LAB — ANGIOTENSIN CONVERTING ENZYME: Angiotensin-Converting Enzyme: 26 U/L (ref 9–67)

## 2018-02-05 ENCOUNTER — Encounter: Payer: Self-pay | Admitting: Family Medicine

## 2018-03-02 NOTE — Progress Notes (Signed)
[facility] [address] [place], [place] [address] Phone: [phone] Subjective:     CC: Neck pain follow-up  GBT:[id number]  Christian Watson is a 57 y.o. male coming in with complaint of neck pain. He did make some ergonomic changes at work and does feel that this has made a difference. Overall is feeling well today.  Patient is making improvement.  Discussed icing regimen which she is not doing at the moment.      Past Medical History:  Diagnosis Date  . Chicken pox   . Sarcoidosis    Past Surgical History:  Procedure Laterality Date  . BRONCHOSCOPY     Bronchial Wash & Transbronchial Biopsies RML & RUL  . CARPAL TUNNEL RELEASE Left   . COLONOSCOPY    . cyst removed  as a child   from chin   Social History   Socioeconomic History  . Marital status: Married    Spouse name: Not on file  . Number of children: Y  . Years of education: Not on file  . Highest education level: Not on file  Occupational History  . Occupation: Pharmacologist: [employer]  Social Needs  . Financial resource strain: Not on file  . Food insecurity:    Worry: Not on file    Inability: Not on file  . Transportation needs:    Medical: Not on file    Non-medical: Not on file  Tobacco Use  . Smoking status: Never Smoker  . Smokeless tobacco: Never Used  . Tobacco comment: Second-hand from parents   Substance and Sexual Activity  . Alcohol use: Yes    Comment: glass of wine with dinner  . Drug use: No  . Sexual activity: Yes    Partners: Female    Comment: Married  Lifestyle  . Physical activity:    Days per week: Not on file    Minutes per session: Not on file  . Stress: Not on file  Relationships  . Social connections:    Talks on phone: Not on file    Gets together: Not on file    Attends religious service: Not on file    Active member of club or organization: Not on file    Attends meetings of clubs or organizations: Not on file      Relationship status: Not on file  Other Topics Concern  . Not on file  Social History Narrative   Originally from [place]. Has lived in [place], [place], & moved to [place] in 2005. 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 Has prior international travel to [place], [place], [place], [place], [place], [place], & [place]. Has a dog currently. No bird, mold, or hot tub exposure. Enjoys playing golf.    Married. College educated. Business owner.   Exercises routinely.   Takes a multivitamin, drinks caffeine, uses herbal remedies.   Occasional alcohol.   Wears a bicycle helmet. Has a smoke alarm in the home. Wears his seatbelt.   Safe in his relationships.   No Known Allergies Family History  Problem Relation Age of Onset  . Heart disease Maternal Grandmother   . Heart disease Maternal Grandfather   . Cancer Maternal Grandfather        unsure what kind  . Heart disease Paternal Grandmother   . Cancer Paternal Grandmother        unsure what  kind  . Heart disease Paternal Grandfather   . Heart disease Mother   . Hypertension Father   . Diabetes Father   . Hearing loss Father   . Diabetes Sister   . Lung disease Neg Hx   . Sarcoidosis Neg Hx   . Rheumatologic disease Neg Hx      Past medical history, social, surgical and family history all reviewed in electronic medical record.  No pertanent information unless stated regarding to the chief complaint.   Review of Systems: No headache, visual changes, nausea, vomiting, diarrhea, constipation, dizziness, abdominal pain, skin rash, fevers, chills, night sweats, weight loss, swollen lymph nodes, body aches, joint swelling, muscle aches, chest pain, shortness of breath, mood changes.   Objective  Blood pressure 110/76, pulse 64, height 5\' 6"  (1.676 m), weight 152 lb (68.9 kg), SpO2 91 %.   General: No apparent distress alert and oriented x3 mood and affect  normal, dressed appropriately.  HEENT: Pupils equal, extraocular movements intact  Respiratory: Patient's speak in full sentences and does not appear short of breath  Cardiovascular: No lower extremity edema, non tender, no erythema  Skin: Warm dry intact with no signs of infection or rash on extremities or on axial skeleton.  Abdomen: Soft nontender  Neuro: Cranial nerves II through XII are intact, neurovascularly intact in all extremities with 2+ DTRs and 2+ pulses.  Lymph: No lymphadenopathy of posterior or anterior cervical chain or axillae bilaterally.  Gait normal with good balance and coordination.  MSK:  Non tender with full range of motion and good stability and symmetric strength and tone of shoulders, elbows, wrist, hip, knee and ankles bilaterally.  Neck: Inspection mild loss of lordosis. No palpable stepoffs. Negative Spurling's maneuver. Mild limitation in sidebending bilaterally by 5 degrees Grip strength and sensation normal in bilateral hands Strength good C4 to T1 distribution No sensory change to C4 to T1 Negative Hoffman sign bilaterally Reflexes normal Tightness of the trapezius bilaterally  Osteopathic findings C2 flexed rotated and side bent right C6 flexed rotated and side bent left T9 extended rotated and side bent left L2 flexed rotated and side bent right Sacrum right on right    Impression and Recommendations:     This case required medical decision making of moderate complexity.      Note: This dictation was prepared with Dragon dictation along with smaller phrase technology. Any transcriptional errors that result from this process are unintentional.

## 2018-03-04 ENCOUNTER — Ambulatory Visit: Payer: 59 | Admitting: Family Medicine

## 2018-03-04 VITALS — BP 110/76 | HR 64 | Ht 66.0 in | Wt 152.0 lb

## 2018-03-04 DIAGNOSIS — G8929 Other chronic pain: Secondary | ICD-10-CM

## 2018-03-04 DIAGNOSIS — M549 Dorsalgia, unspecified: Secondary | ICD-10-CM

## 2018-03-04 DIAGNOSIS — M999 Biomechanical lesion, unspecified: Secondary | ICD-10-CM | POA: Diagnosis not present

## 2018-03-04 DIAGNOSIS — M542 Cervicalgia: Secondary | ICD-10-CM | POA: Diagnosis not present

## 2018-03-04 NOTE — Patient Instructions (Signed)
Good to see you  Gustavus Bryantce is your friend.  Stay active.  [phone] when you need us

## 2018-03-05 ENCOUNTER — Encounter: Payer: Self-pay | Admitting: Family Medicine

## 2018-03-05 NOTE — Assessment & Plan Note (Signed)
Stable overall.  Patient has made significant improvement.  We discussed icing regimen and home exercises.  Patient is going to continue with the new ergonomics that is done.  Follow-up with me again in 3 to 6 months

## 2018-03-05 NOTE — Assessment & Plan Note (Signed)
Decision today to treat with OMT was based on Physical Exam  After verbal consent patient was treated with HVLA, ME, FPR techniques in cervical, thoracic, lumbar and sacral areas  Patient tolerated the procedure well with improvement in symptoms  Patient given exercises, stretches and lifestyle modifications  See medications in patient instructions if given  Patient will follow up in 1-2 weeks 

## 2018-04-08 ENCOUNTER — Ambulatory Visit
Admission: RE | Admit: 2018-04-08 | Discharge: 2018-04-08 | Disposition: A | Discharge status: Home / Self Care | Payer: 59 | Source: Ambulatory Visit | Attending: Family Medicine | Admitting: Family Medicine

## 2018-04-08 ENCOUNTER — Encounter: Payer: Self-pay | Admitting: Family Medicine

## 2018-04-08 ENCOUNTER — Ambulatory Visit: Payer: 59 | Admitting: Family Medicine

## 2018-04-08 VITALS — BP 128/78 | HR 78 | Temp 98.2°F | Wt 153.8 lb

## 2018-04-08 DIAGNOSIS — M47816 Spondylosis without myelopathy or radiculopathy, lumbar region: Secondary | ICD-10-CM | POA: Diagnosis not present

## 2018-04-08 DIAGNOSIS — R1900 Intra-abdominal and pelvic swelling, mass and lump, unspecified site: Secondary | ICD-10-CM | POA: Diagnosis not present

## 2018-04-08 DIAGNOSIS — R109 Unspecified abdominal pain: Secondary | ICD-10-CM | POA: Diagnosis not present

## 2018-04-08 LAB — POCT URINALYSIS DIP (PROADVANTAGE DEVICE)
BILIRUBIN UA: NEGATIVE
BILIRUBIN UA: NEGATIVE mg/dL
GLUCOSE UA: NEGATIVE mg/dL
Leukocytes, UA: NEGATIVE
Nitrite, UA: NEGATIVE
Protein Ur, POC: NEGATIVE mg/dL
RBC UA: NEGATIVE
Specific Gravity, Urine: 1.015
Urobilinogen, Ur: 3.5
pH, UA: 6 (ref 5.0–8.0)

## 2018-04-08 NOTE — Progress Notes (Signed)
   Subjective:    Patient ID: Christian Watson, male    DOB: Apr 13, 1961, 57 y.o.   MRN: [id number]  HPI He complains of a one-month history of bilateral flank discomfort.  Motion has no effect.  He has been no nausea, vomiting, change with food.  His BMs have been intermittently normal.  He does have a previous history of sarcoid.  He has had no abdominal pain.   Review of Systems     Objective:   Physical Exam Normal motion of his back.  No palpable tenderness is noted.  Abdominal exam shows normal bowel sounds and questionable pulsatile mid abdominal mass. Urinalysis is negative      Assessment & Plan:  Pulsatile abdominal mass - Plan: US Aorta  Flank pain - Plan: DG Lumbar Spine Complete, POCT Urinalysis DIP (Proadvantage Device) Difficult to say what is causing the flank pain but think it is reasonable to get an x-ray Pulsatile mass could easily be the fact that he is thin and its easy to feel his aorta.

## 2018-04-09 ENCOUNTER — Encounter: Payer: Self-pay | Admitting: Family Medicine

## 2018-04-16 ENCOUNTER — Ambulatory Visit
Admission: RE | Admit: 2018-04-16 | Discharge: 2018-04-16 | Disposition: A | Discharge status: Home / Self Care | Payer: 59 | Source: Ambulatory Visit | Attending: Family Medicine | Admitting: Family Medicine

## 2018-04-16 ENCOUNTER — Encounter: Payer: Self-pay | Admitting: Family Medicine

## 2018-04-16 ENCOUNTER — Ambulatory Visit: Payer: 59 | Admitting: Family Medicine

## 2018-04-16 VITALS — BP 128/74 | HR 63 | Temp 97.9°F | Wt 155.2 lb

## 2018-04-16 DIAGNOSIS — N529 Male erectile dysfunction, unspecified: Secondary | ICD-10-CM | POA: Insufficient documentation

## 2018-04-16 DIAGNOSIS — R1900 Intra-abdominal and pelvic swelling, mass and lump, unspecified site: Secondary | ICD-10-CM | POA: Diagnosis not present

## 2018-04-16 HISTORY — DX: Male erectile dysfunction, unspecified: N52.9

## 2018-04-16 MED ORDER — SILDENAFIL CITRATE 20 MG PO TABS: ORAL_TABLET | ORAL | 5 refills | Status: DC

## 2018-04-16 NOTE — Progress Notes (Signed)
   Subjective:    Patient ID: Christian Watson, male    DOB: 26-Dec-1960, 57 y.o.   MRN: [id number]  HPI He is here for consult concerning erectile dysfunction.  He has had problems with getting and maintaining erections for several years.  In the past he has tried Viagra and also Cialis.  He did have unacceptable side effects with Cialis.  He would like to be placed back on Viagra.  He does not smoke and drinks socially.  He is on no other medications.   Review of Systems     Objective:   Physical Exam Alert and in no distress otherwise not examined       Assessment & Plan:  Erectile dysfunction, unspecified erectile dysfunction type - Plan: sildenafil (REVATIO) 20 MG tablet Discussed the use of sildenafil with him.  He can try up to 5 pills at one time.  Explained the fact that it is like priming the pump and he might not need medication every time he wants to have sex.

## 2018-10-11 ENCOUNTER — Encounter: Payer: Self-pay | Admitting: Sports Medicine

## 2018-10-11 ENCOUNTER — Other Ambulatory Visit: Payer: Self-pay

## 2018-10-11 ENCOUNTER — Ambulatory Visit: Payer: 59 | Admitting: Sports Medicine

## 2018-10-11 ENCOUNTER — Ambulatory Visit: Payer: Self-pay

## 2018-10-11 VITALS — BP 126/70 | Ht 66.0 in | Wt 148.0 lb

## 2018-10-11 DIAGNOSIS — M79661 Pain in right lower leg: Secondary | ICD-10-CM

## 2018-10-11 NOTE — Progress Notes (Signed)
   Subjective:    Patient ID: Christian Watson, male    DOB: 10-10-1960, 58 y.o.   MRN: [id number]  HPI chief complaint: Right calf pain  Very pleasant 58 year old male comes in today complaining of 1 week of right calf pain.  Pain began acutely while working out at the gym.  He was turning on his right foot after doing squats and he felt a pop in the right calf.  Immediate pain.  He noticed swelling as well.  He has been self treating with ice, elevation, and an Ace wrap.  Symptoms have improved but he is still walking with a significant limp.  He is unable to put his heel completely flat on the ground.  He has not noticed any bruising.  He denies pain distally at the Achilles tendon.  He denies any significant issues with his calf in the past.  Past medical history reviewed Medications reviewed Allergies reviewed    Review of Systems    As above Objective:   Physical Exam  Well-developed, well-nourished.  No acute distress.  Awake alert and oriented x3.  Vital signs reviewed  Right calf: Patient is tender to palpation along the medial head of the gastrocnemius.  No palpable defect.  Slight amount of swelling but no ecchymosis.  Achilles tendon is intact without pain.  Patient ambulates with a noticeable limp and prefers to ambulate on his right forefoot.  MSK of the right calf was performed.  Limited images were obtained.  Achilles tendon is intact.  There are hypoechoic changes at the interface of the medial head of the gastrocnemius and the soleus consistent with a partial tear here.  I do not see any accompanying hematoma.  Findings consistent with a partial tear of the medial head of the gastroc.      Assessment & Plan:   Right calf pain secondary to partial gastrocnemius tear  Calf sleeve to be worn with ambulation.  5/16 inch lift for both shoes.  I provided him with a note to give to his gym which states that he is not to work out for at least the next 6 weeks.  He has  crutches to assist with ambulation and he will follow-up with me in 3 weeks for reevaluation and repeat ultrasound.  I explained to him the importance of not stretching his calf during the acute phase of this injury.  He may use over-the-counter anti-inflammatories or Tylenol as needed for pain.  He may ice as needed for pain as well.  Call with questions or concerns prior to his follow-up visit.

## 2018-10-25 ENCOUNTER — Ambulatory Visit (INDEPENDENT_AMBULATORY_CARE_PROVIDER_SITE_OTHER): Payer: 59 | Admitting: Sports Medicine

## 2018-10-25 ENCOUNTER — Other Ambulatory Visit: Payer: Self-pay

## 2018-10-25 VITALS — BP 138/74 | Ht 66.0 in | Wt 148.0 lb

## 2018-10-25 DIAGNOSIS — M79661 Pain in right lower leg: Secondary | ICD-10-CM

## 2018-10-25 NOTE — Progress Notes (Signed)
   Subjective:    Patient ID: Christian Watson, male    DOB: 10-Dec-1960, 58 y.o.   MRN: [id number]  HPI   Christian Watson comes in today for follow-up on a right calf strain.  He is doing better.  He is about 50% improved per his report.  He was on crutches for about a week.  He has been wearing his compression sleeve.  He has discontinued his heel lifts.   Review of Systems    As above Objective:   Physical Exam  Well-developed, well-nourished.  No acute distress.  Awake alert and oriented x3.  Vital signs reviewed  Right calf: There is still some slight tenderness to palpation along the medial head of the gastroc.  No soft tissue swelling.  No palpable defect.  No ecchymosis.  Neurovascularly intact distally.  Walking without a limp.  Brief bedside MSK shows no hematoma.  There is an area of disorganized tissue at the interface of the gastrocnemius and the soleus medially.  This hyperechoic change suggests early healing of a previous gastroc tear.     Assessment & Plan:   3 weeks status post right gastroc tear-healing  Patient is provided with Alfredson heel drop exercises.  He will start those immediately but is instructed to stop if he has pain.  Once he is able to perform the heel drops painlessly, he may then start to resume recreational activities such as golf.  I explained to Christian Watson that this may take another 3 weeks or so.  He understands.  He is also encouraged to continue wearing his compression sleeve with activity for at least the next 3 months.  He will follow-up with me for ongoing or recalcitrant issues.

## 2018-10-26 ENCOUNTER — Encounter: Payer: Self-pay | Admitting: Family Medicine

## 2018-10-26 ENCOUNTER — Ambulatory Visit (INDEPENDENT_AMBULATORY_CARE_PROVIDER_SITE_OTHER): Payer: 59 | Admitting: Family Medicine

## 2018-10-26 ENCOUNTER — Other Ambulatory Visit: Payer: Self-pay

## 2018-10-26 DIAGNOSIS — J0100 Acute maxillary sinusitis, unspecified: Secondary | ICD-10-CM | POA: Diagnosis not present

## 2018-10-26 MED ORDER — AMOXICILLIN-POT CLAVULANATE 875-125 MG PO TABS: 1.0000 | ORAL_TABLET | Freq: Two times a day (BID) | ORAL | 0 refills | Status: DC

## 2018-10-26 MED ORDER — FLUTICASONE PROPIONATE 50 MCG/ACT NA SUSP: 2.0000 | Freq: Every day | NASAL | 6 refills | Status: DC

## 2018-10-26 MED ORDER — BENZONATATE 200 MG PO CAPS: 200.0000 mg | ORAL_CAPSULE | Freq: Two times a day (BID) | ORAL | 0 refills | Status: DC | PRN

## 2018-10-26 NOTE — Patient Instructions (Signed)
telehealth

## 2018-10-26 NOTE — Progress Notes (Signed)
   Virtual Visit via Video   I connected with Christian Watson  on 10/26/18 at  1:30 PM EDT by a video enabled telemedicine application and verified that I am speaking with the correct person using two identifiers. Location patient: Work-office--> secured only person in office.  Location provider: [facility], Office Persons participating in the virtual visit: Patient, Dr. Claiborne Billings, R.Baker, LPN  I discussed the limitations of evaluation and management by telemedicine and the availability of in person appointments. The patient expressed understanding and agreed to proceed.  Subjective:   Chief Complaint  Patient presents with  . Cough    1st week of Feb., Dry cough   . Nasal Congestion    HPI: Patient complains today of a dry cough that started approximately 8 weeks ago.  He also endorses nasal congestion, sinus pressure that will not completely resolve. He had a similar case in June and was tx with Augmentin and prednisone. He does not feel it is as bad as that time, but is getting there. His wife has similar symptoms. Neither have traveled at that time.  No travel, denies fever, chills, nausea, vomit.  No COVID-19 exposure.   ROS: See pertinent positives and negatives per HPI.  Patient Active Problem List   Diagnosis Date Noted  . Erectile dysfunction 04/16/2018  . Acute bursitis of left shoulder 11/16/2017  . Chronic neck and back pain 10/14/2017  . Nonallopathic lesion of lumbosacral region 08/18/2017  . Nonallopathic lesion of sacral region 08/18/2017  . Nonallopathic lesion of thoracic region 08/18/2017  . Hip impingement syndrome, right 08/18/2017  . Acute right-sided low back pain without sciatica 06/05/2017  . Sarcoidosis 05/21/2011    Social History   Tobacco Use  . Smoking status: Never Smoker  . Smokeless tobacco: Never Used  . Tobacco comment: Second-hand from parents   Substance Use Topics  . Alcohol use: Yes    Comment: glass of wine with dinner     Current Outpatient Medications:  .  sildenafil (REVATIO) 20 MG tablet, Take up to 5 pills as needed for ED (Patient not taking: Reported on 10/11/2018), Disp: 30 tablet, Rfl: 5  No Known Allergies  Objective:   Gen: No acute distress. Nontoxic in appearance.  HENT: AT. [place].  MMM.  Eyes:Pupils Equal Round Reactive to light, Extraocular movements intact,  Conjunctiva without redness, discharge or icterus. Chest: Cough, very mild. Dry.  Neuro: Alert. Oriented x3   Assessment and Plan:  Acute non-recurrent maxillary sinusitis Rest, hydrate.  flonase prescribed, start  mucinex (DM if cough) and nasal saline flushes.    augmentin prescribed, take until completed.  Tessalon perles prescribed.  If cough present it can last up to 6-8 weeks.  F/U 2 weeks of not improved.   Felix Pacini, DO 10/26/2018

## 2018-11-01 ENCOUNTER — Telehealth: Payer: Self-pay

## 2018-11-01 NOTE — Telephone Encounter (Signed)
Try a probiotic- Align is a good one. Please add abx to pts intolerance list.

## 2018-11-01 NOTE — Telephone Encounter (Signed)
Pt was called and given information. He will call back if symptoms do not get better or worsen

## 2018-11-01 NOTE — Telephone Encounter (Signed)
I believe I am only seeing part of the message.  What are they asking? Guidance on the diarrhea? Asking for another script of Abx?

## 2018-11-01 NOTE — Telephone Encounter (Signed)
Copied from CRM [id number]. Topic: General - Other >> Nov 01, 2018  8:53 AM Percival Spanish wrote:  Pt wife said the following med gave pt diarr and he quit taking it   amoxicillin-clavulanate (AUGMENTIN) 875-125 MG tablet  Called and spoke with patient and he was able to take 5 days of abx. Sat morning was the last dose of abx, using the bathroom 5-8 times daily. Denies fever or blood in stool.  Pt states he has a little congestion still left and continues to use flonase daily. Pt was educated on staying hydrated.   Please advise

## 2018-11-01 NOTE — Telephone Encounter (Signed)
Pt was just sending as a FYI and he is concerned with diarrhea and wanted to know how long it would take to go away and if he needs to do anything

## 2018-11-25 ENCOUNTER — Telehealth: Payer: Self-pay | Admitting: *Deleted

## 2018-11-25 NOTE — Telephone Encounter (Signed)
Letter created and faxed.

## 2019-01-12 ENCOUNTER — Encounter: Payer: Self-pay | Admitting: Family Medicine

## 2019-01-12 ENCOUNTER — Ambulatory Visit (INDEPENDENT_AMBULATORY_CARE_PROVIDER_SITE_OTHER): Payer: 59 | Admitting: Family Medicine

## 2019-01-12 ENCOUNTER — Other Ambulatory Visit: Payer: Self-pay

## 2019-01-12 VITALS — Temp 98.1°F | Wt 148.6 lb

## 2019-01-12 DIAGNOSIS — M546 Pain in thoracic spine: Secondary | ICD-10-CM

## 2019-01-12 DIAGNOSIS — R0781 Pleurodynia: Secondary | ICD-10-CM | POA: Diagnosis not present

## 2019-01-12 NOTE — Progress Notes (Signed)
VIRTUAL VISIT VIA VIDEO  I connected with Christian HockStephen K Zeiders on 01/12/19 at  3:30 PM EDT by a video enabled telemedicine application and verified that I am speaking with the correct person using two identifiers. Location patient: Home Location provider: [facility], [facility] Persons participating in the virtual visit: Patient, Dr. Claiborne BillingsKuneff and R.Baker, LPN  I discussed the limitations of evaluation and management by telemedicine and the availability of in person appointments. The patient expressed understanding and agreed to proceed.   SUBJECTIVE Chief Complaint  Patient presents with   pain right side    HPI: Christian Watson is a 58 y.o. year old male sent for right sided pain.  Patient reports rib pain over right lateral cage few inches inferior and posterior to axilla of 3 weeks duration.  He states that it initially was bothering him with movement only.  It has progressed over the 3 weeks to be a persistent pain ranging from 2-7/10.  He states he can even be sitting and have a sharp pain occur in this location.  He denies any injury or overactivity.  He had an upper respiratory infection in March, otherwise he has been healthy without any illness.  He has tried heat which was not helpful.  He has tried Aleve which helps decrease the pain some but does not resolve the pain. ROS: See pertinent positives and negatives per HPI.  Patient Active Problem List   Diagnosis Date Noted   Erectile dysfunction 04/16/2018   Acute bursitis of left shoulder 11/16/2017   Chronic neck and back pain 10/14/2017   Nonallopathic lesion of lumbosacral region 08/18/2017   Nonallopathic lesion of sacral region 08/18/2017   Nonallopathic lesion of thoracic region 08/18/2017   Hip impingement syndrome, right 08/18/2017   Acute right-sided low back pain without sciatica 06/05/2017   Sarcoidosis 05/21/2011    Social History   Tobacco Use   Smoking status: Never Smoker   Smokeless  tobacco: Never Used   Tobacco comment: Second-hand from parents   Substance Use Topics   Alcohol use: Yes    Comment: glass of wine with dinner    Current Outpatient Medications:    Multiple Vitamin (MULTIVITAMIN) tablet, Take 1 tablet by mouth daily., Disp: , Rfl:    sildenafil (REVATIO) 20 MG tablet, Take up to 5 pills as needed for ED (Patient not taking: Reported on 10/11/2018), Disp: 30 tablet, Rfl: 5  Allergies  Allergen Reactions   Augmentin [Amoxicillin-Pot Clavulanate] Diarrhea    OBJECTIVE: Temp 98.1 F (36.7 C)    Wt 148 lb 9 oz (67.4 kg)    BMI 23.98 kg/m  Gen: No acute distress. Nontoxic in appearance.  HENT: AT. [place].  MMM.  Eyes:Pupils Equal Round Reactive to light, Extraocular movements intact,  Conjunctiva without redness, discharge or icterus. CV: no edema Chest: Cough or shortness of breath not present.  Skin: no rashes, purpura or petechiae.  Msk: No erythema, full range of motion of bilateral arms and thoracic spine.  Feels a deep discomfort with palpation over the area.  Has a mild discomfort with deep breath. Neuro:  Normal gait. Alert. Oriented x3   ASSESSMENT AND PLAN: Christian HockStephen K Yono is a 58 y.o. male present for  Rib pain on right side/Acute right-sided thoracic back pain Uncertain etiology of his pain, no known injury, illness or over activity. Pain is progressing from intermittent pain with movement to persistent pain despite activity. Since movement initially caused pain- would most likely be MSK in  origin.  Patient states with deep breath it feels deeper in his ribs. - Rest, OTC NSAIDS. Obtain xrays of ribs and chest.  - DG Ribs Unilateral Right; Future - DG Chest 2 View; Future - f/u dependent upon results. Consider rib dysfunction and OMT.   > 25 minutes spent with patient, >50% of time spent face to face   Orders Placed This Encounter  Procedures   DG Ribs Unilateral Right   DG Chest 2 View     [facility], DO 01/12/2019

## 2019-01-13 ENCOUNTER — Other Ambulatory Visit: Payer: Self-pay

## 2019-01-13 ENCOUNTER — Ambulatory Visit
Admission: RE | Admit: 2019-01-13 | Discharge: 2019-01-13 | Disposition: A | Discharge status: Home / Self Care | Payer: 59 | Source: Ambulatory Visit | Attending: Family Medicine | Admitting: Family Medicine

## 2019-01-13 DIAGNOSIS — M546 Pain in thoracic spine: Secondary | ICD-10-CM

## 2019-01-13 DIAGNOSIS — R0781 Pleurodynia: Secondary | ICD-10-CM

## 2019-01-14 ENCOUNTER — Telehealth: Payer: Self-pay | Admitting: Family Medicine

## 2019-01-14 MED ORDER — NAPROXEN 500 MG PO TABS: 500.0000 mg | ORAL_TABLET | Freq: Two times a day (BID) | ORAL | 0 refills | Status: DC

## 2019-01-14 MED ORDER — CYCLOBENZAPRINE HCL 10 MG PO TABS: 10.0000 mg | ORAL_TABLET | Freq: Three times a day (TID) | ORAL | 0 refills | Status: DC | PRN

## 2019-01-14 NOTE — Telephone Encounter (Signed)
Pt was called and given lab results/insructions, he verbalized understanding

## 2019-01-14 NOTE — Telephone Encounter (Signed)
Please inform patient the following information: His xrays are normal. He does have a fair amount of stool burden in his colon. If he has been constipated this can cause irritation to the RUQ- not typically as high as where he was reporting symptoms.  Take stool softener or miralax 1/2-1 cap daily to relieve stool burden. I have called in naproxen BID with food for 7 days then PRN and muscle relaxer for presumed rib muscle strain. Take flexeril at night- if it does not make him sleepy he can take 1 other time in the day if needed.   F/u 2-4 weeks  if still present

## 2019-01-25 IMAGING — DX DG CHEST 2V
2 series · 2 of 2 positions shown · non-contrast
Comparison: Chest x-ray of 05/13/2017

CLINICAL DATA: Productive cough for 2 weeks, history of sarcoidosis

EXAM:
CHEST - 2 VIEW

[chest pa]
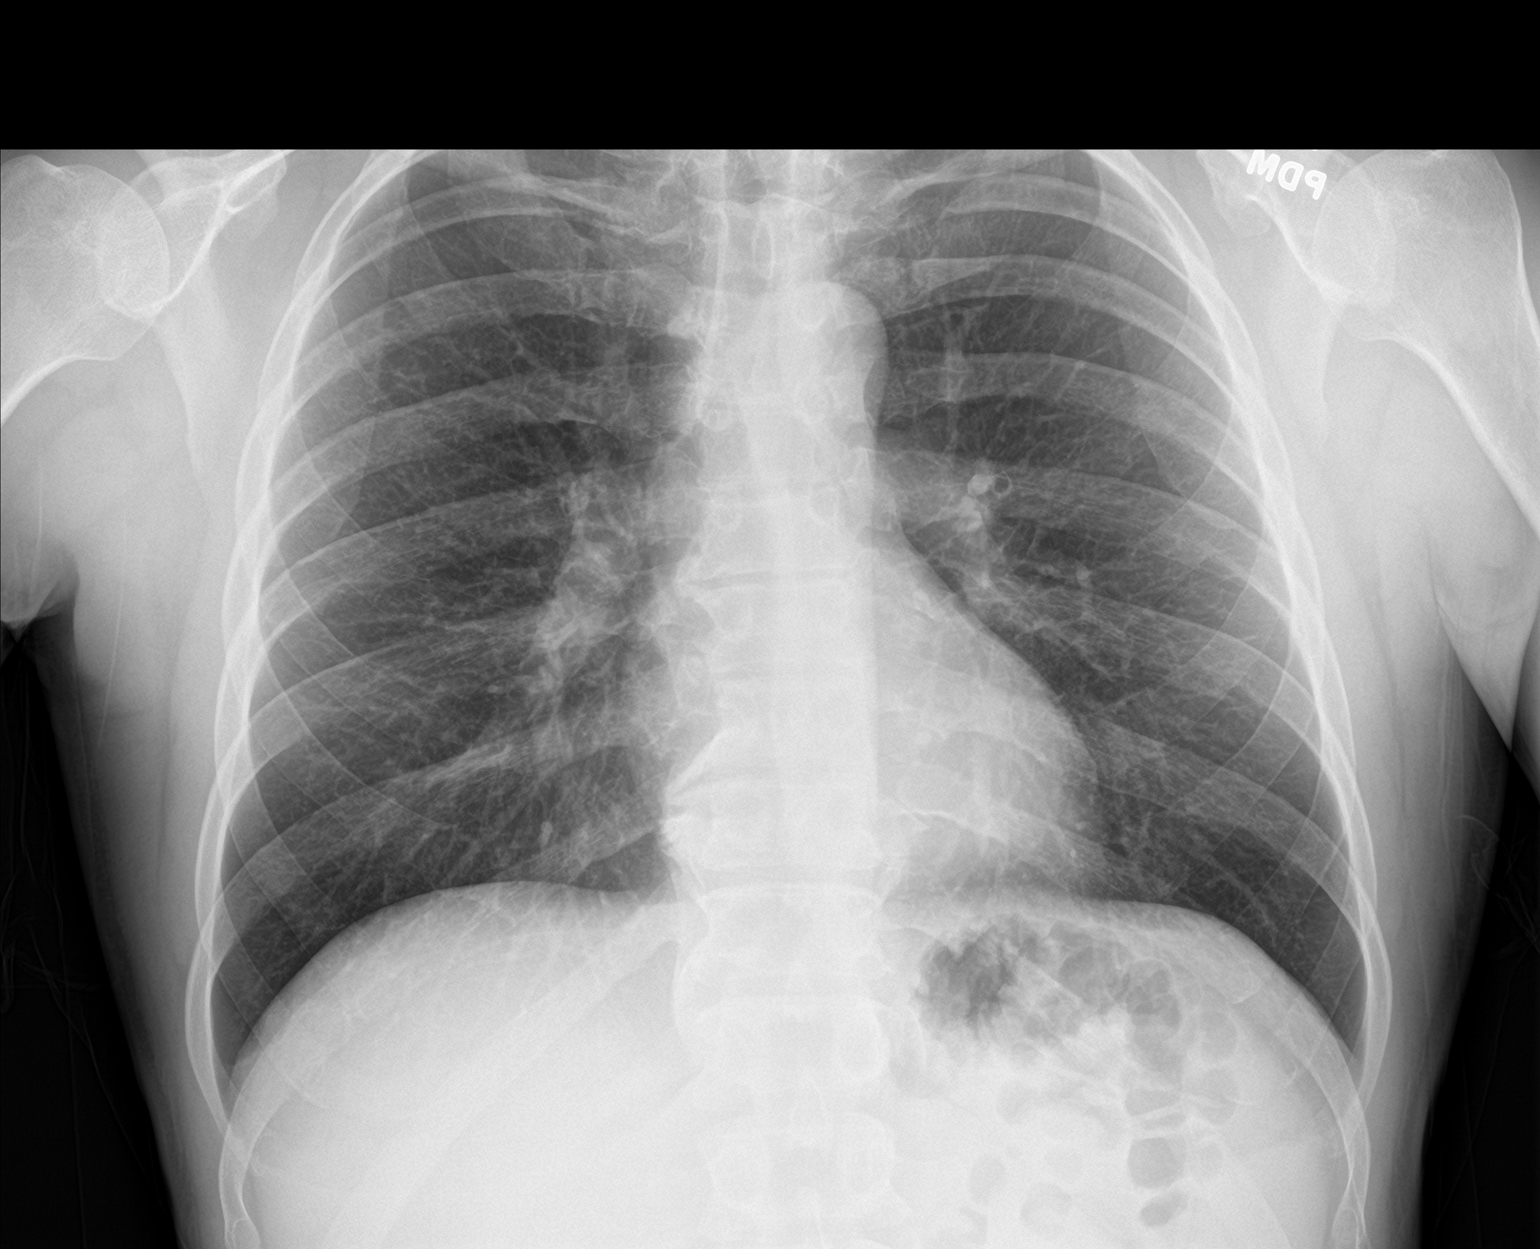

[chest lat]
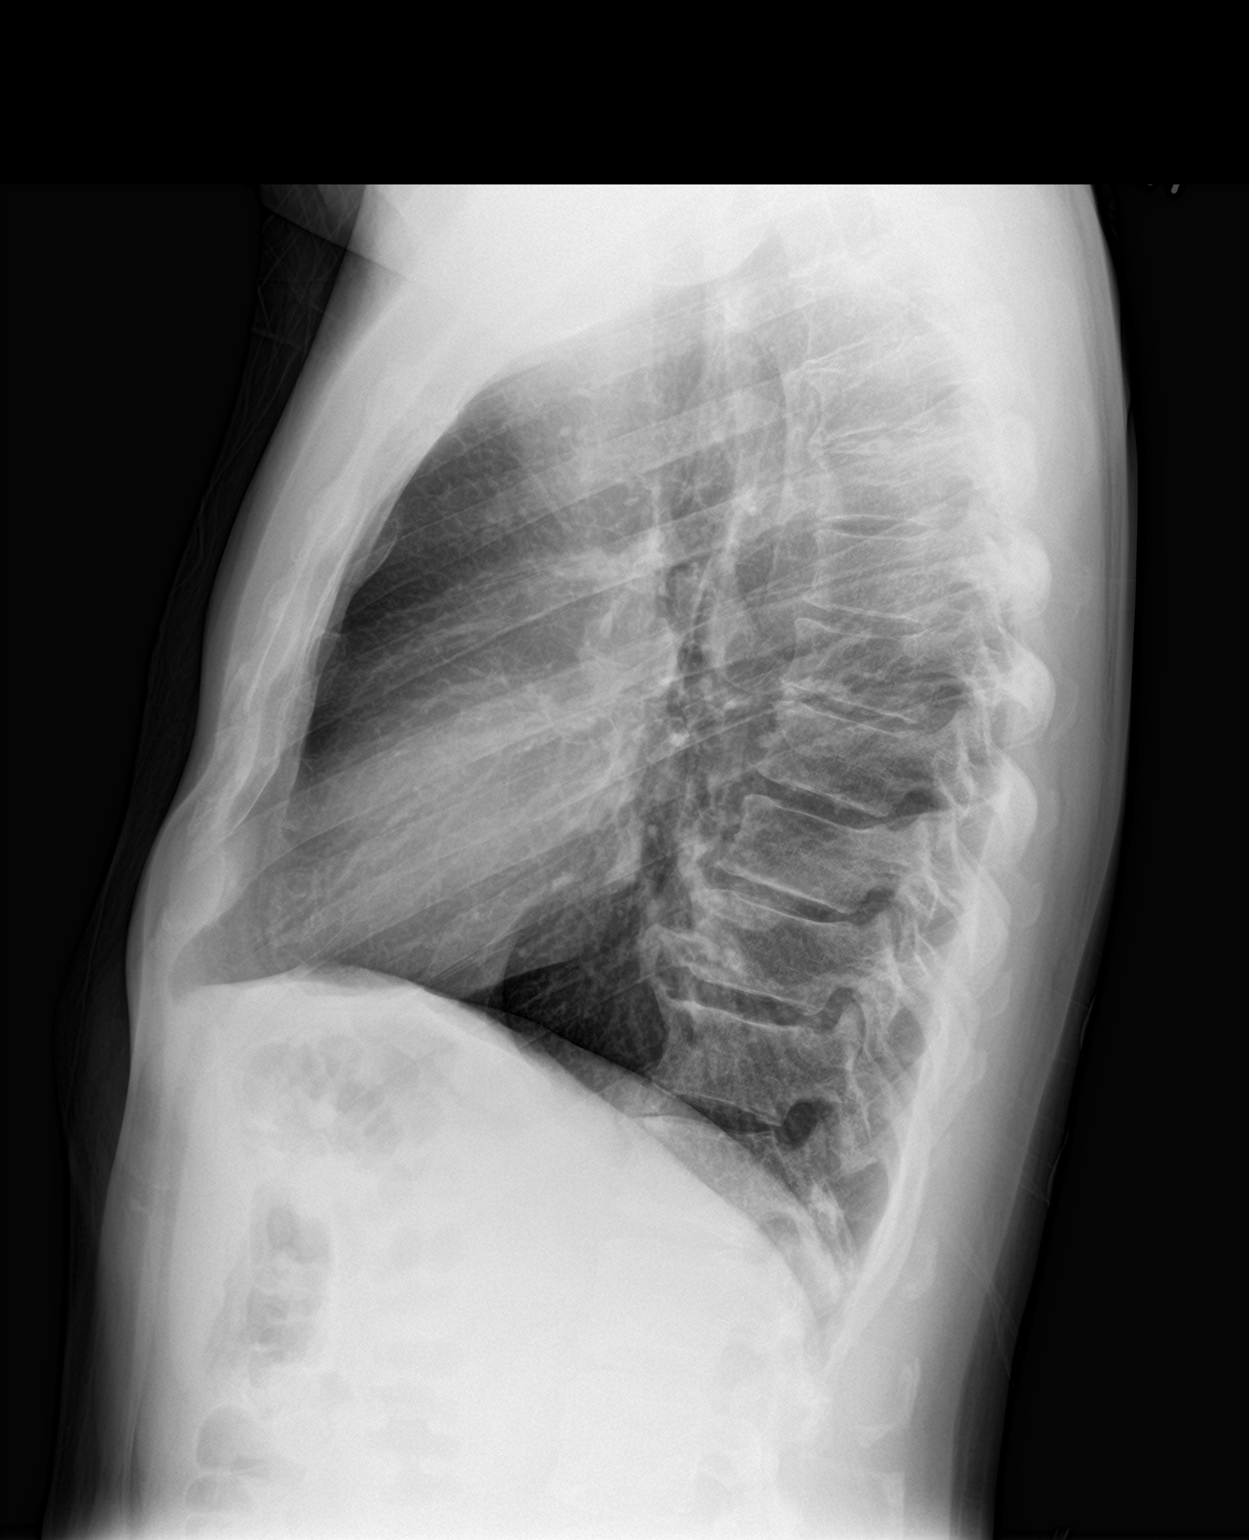

[2 of 2 positions shown; findings below may reference images not displayed]

FINDINGS: No active infiltrate or effusion is seen. Mediastinal and hilar
contours are unremarkable. There is mild peribronchial thickening
which can be seen with bronchitis. The heart is within normal limits
in size. No acute bony abnormality is seen with mild degenerative
change in the lower thoracic spine noted.
IMPRESSION: 1. No active lung disease.
2. Mild peribronchial thickening can be seen with bronchitis.

## 2019-10-06 ENCOUNTER — Ambulatory Visit: Payer: 59 | Attending: Internal Medicine

## 2019-10-06 DIAGNOSIS — Z23 Encounter for immunization: Secondary | ICD-10-CM

## 2019-10-06 NOTE — Progress Notes (Signed)
   Covid-19 Vaccination Clinic  Name:  DOMANIQUE LUCKETT    MRN: [id number] DOB: 02/24/1961  10/06/2019  Mr. Nimmons was observed post Covid-19 immunization for 15 minutes without incident. He was provided with Vaccine Information Sheet and instruction to access the V-Safe system.   Mr. Shackleton was instructed to call 911 with any severe reactions post vaccine: Marland Kitchen Difficulty breathing  . Swelling of face and throat  . A fast heartbeat  . A bad rash all over body  . Dizziness and weakness   Immunizations Administered    Name Date Dose VIS Date Route   Pfizer COVID-19 Vaccine 10/06/2019  3:09 PM 0.3 mL 07/08/2019 Intramuscular   Manufacturer: [employer], [employer]   Lot: [id number]   NDC: [id number]

## 2019-10-31 ENCOUNTER — Ambulatory Visit: Payer: 59 | Attending: Internal Medicine

## 2019-10-31 DIAGNOSIS — Z23 Encounter for immunization: Secondary | ICD-10-CM

## 2020-10-26 ENCOUNTER — Encounter: Payer: Self-pay | Admitting: Family Medicine

## 2020-10-26 ENCOUNTER — Other Ambulatory Visit: Payer: Self-pay

## 2020-10-26 ENCOUNTER — Ambulatory Visit: Payer: 59 | Admitting: Family Medicine

## 2020-10-26 VITALS — BP 130/68 | HR 60 | Temp 98.1°F | Ht 66.0 in | Wt 158.0 lb

## 2020-10-26 DIAGNOSIS — G8929 Other chronic pain: Secondary | ICD-10-CM | POA: Insufficient documentation

## 2020-10-26 DIAGNOSIS — M47819 Spondylosis without myelopathy or radiculopathy, site unspecified: Secondary | ICD-10-CM | POA: Diagnosis not present

## 2020-10-26 DIAGNOSIS — I739 Peripheral vascular disease, unspecified: Secondary | ICD-10-CM

## 2020-10-26 DIAGNOSIS — Z8781 Personal history of (healed) traumatic fracture: Secondary | ICD-10-CM | POA: Diagnosis not present

## 2020-10-26 DIAGNOSIS — M546 Pain in thoracic spine: Secondary | ICD-10-CM

## 2020-10-26 NOTE — Progress Notes (Signed)
This visit occurred during the SARS-CoV-2 public health emergency.  Safety protocols were in place, including screening questions prior to the visit, additional usage of staff PPE, and extensive cleaning of exam room while observing appropriate contact time as indicated for disinfecting solutions.    Christian Watson , August 16, 1960, 60 y.o., male MRN: [id number] Patient Care Team    Relationship Specialty Notifications Start End  Natalia Leatherwood, DO PCP - General Family Medicine  10/11/18   Roslynn Amble, MD Consulting Physician Pulmonary Disease  06/24/17     Chief Complaint  Patient presents with  . Back Pain    Pt c/o mid back pain on left and right x 6 mos;   . Leg Pain    Pt c/o b/l leg pain R>L x 2 mos mostly located in thigh; worsen after sitting for greater than an hour     Subjective: Pt presents for an OV with multiple complaints. Leg discomfort: Patient reports he has bilateral leg discomfort that seems to be worse after sitting for long periods of time or when he is driving.  He reports the worst area of discomfort is localized in his right mid anterior thigh.  He describes the pain as severe lasting 5 to 10 minutes.  He has difficulty explaining characterization of pain other than severe.  He reports nothing particular takes pain away other than time.  He has bilateral calf pain that feels similar, but less severe times.  He states his father has had to have stent placements for peripheral artery disease.  Back pain: Patient reports he has continued to have lower thoracic upper lumbar back pain daily that has been the same for many years.  He has seen sports medicine for this condition in the past.  He states it is not debilitating and he is able to continue his daily routines, he is just tired of being uncomfortable.  He desires not to be on medications for this condition.  He does exercise routinely and stays in shape.  He has had x-rays of his thoracic and lumbar  spine, I do not see MRIs of his lumbar spine. Prior note: Patient presents today for establishment with complaints of right back and flank discomfort of one month duration. He feels like it is deep ache, dull pressure with sometimes burning sensation. He is concerned it has something to do with his liver. Patient has a history of sarcoidosis, followed by pulmonology. He reports his last checkup was actually good. Angiotensin-converting enzyme was 32 and his calcium was normal. Chest x-ray was normal, with a noted stable T7 compression. CT 2012 showed faint mesenteric edema, hepatic steatosis and imaging consistent with sarcoidosis of the lungs and spleen. Patient reports he works out very frequently. He does not recall an injury or overactivity injury to this area. He has not had a back injury in the past. He denies any fever, nausea, vomiting, sweating, diarrhea or dysuria. He endorses normal urine output. He did see a sports medicine doctor a few weeks ago he had a pulled muscle. No medications were provided. Patient has not been taking any medications.  Lumbar spine xray  03/2018: FINDINGS: Punctate calcifications project over both kidneys compatible with renal stones. Normal alignment within the lumbar spine. Anterior degenerative spurring from L2-3 to L4-5. Degenerative facet disease throughout the lumbar spine. No fracture. SI joints are symmetric and unremarkable. IMPRESSION: Degenerative spurring.  No acute bony abnormality.    Depression screen [facility] 2/9 10/26/2020 02/05/2018  06/24/2017 06/03/2017 04/03/2015  Decreased Interest 0 0 0 0 0  Down, Depressed, Hopeless 0 0 0 0 0  PHQ - 2 Score 0 0 0 0 0    Allergies  Allergen Reactions  . Augmentin [Amoxicillin-Pot Clavulanate] Diarrhea   Social History   Social History Narrative   Originally from [place]. Has lived in [place], [place], & moved to [place] in 2005. 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 Has prior international travel to [place], [place], [place], [place], [place], [place], & [place]. Has a dog currently. No bird, mold, or hot tub exposure. Enjoys playing golf.    Married. College educated. Business owner.   Exercises routinely.   Takes a multivitamin, drinks caffeine, uses herbal remedies.   Occasional alcohol.   Wears a bicycle helmet. Has a smoke alarm in the home. Wears his seatbelt.   Safe in his relationships.   Past Medical History:  Diagnosis Date  . Acute bursitis of left shoulder 11/16/2017  . Chicken pox   . Hip impingement syndrome, right 08/18/2017  . Sarcoidosis    Past Surgical History:  Procedure Laterality Date  . BRONCHOSCOPY     Bronchial Wash & Transbronchial Biopsies RML & RUL  . CARPAL TUNNEL RELEASE Left   . COLONOSCOPY    . cyst removed  as a child   from chin   Family History  Problem Relation Age of Onset  . Heart disease Maternal Grandmother   . Heart disease Maternal Grandfather   . Cancer Maternal Grandfather        unsure what kind  . Heart disease Paternal Grandmother   . Cancer Paternal Grandmother        unsure what kind  . Heart disease Paternal Grandfather   . Heart disease Mother   . Hypertension Father   . Diabetes Father   . Hearing loss Father   . Diabetes Sister   . Lung disease Neg Hx   . Sarcoidosis Neg Hx   . Rheumatologic disease Neg Hx     All past medical history, surgical history, allergies, family history, immunizations andmedications were updated in the EMR today and reviewed under the history and medication portions of their EMR.     ROS: Negative, with the exception of above mentioned in HPI   Objective:  BP 130/68   Pulse 60   Temp 98.1 F (36.7 C) (Oral)   Ht 5\' 6"  (1.676 m)   Wt 158 lb (71.7 kg)   SpO2 98%   BMI 25.50 kg/m  Body mass index is 25.5 kg/m. Gen: Afebrile. No acute distress. Nontoxic in appearance, well developed, well  nourished.  HENT: AT. [place].  Eyes:Pupils Equal Round Reactive to light, Extraocular movements intact,  Conjunctiva without redness, discharge or icterus. CV: RRR, no edema MSK: Full range of motion lower extremity bilaterally.  Bounding pulse right femoral.  Equal pulses bilaterally posterior tibialis.  Negative Homans. Skin: no rashes, purpura or petechiae.  Neuro:  Normal gait. PERLA. EOMi. Alert. Oriented x3  Psych: Normal affect, dress and demeanor. Normal speech. Normal thought content and judgment.  No exam data present No results found. No results found for this or any previous visit (from the past 24 hour(s)).  Assessment/Plan: STEPAN VERRETTE is a 60 y.o. male present for OV for  Chronic bilateral thoracic back pain Discussed options with him today concerning his chronic thoracic back pain.  He has tried  physical therapy, OMT and supportive measures over the years.  He has had a compression fraction in the past.  He has known lumbar degenerative disease with bony spurs.  He is not interested in daily medication or anti-inflammatories this time. He is agreeable to be evaluated by Ortho-neuro for possible injection/nerve ablation etc.  Intermittent claudication (HCC) Symptoms he is reporting could be secondary to peripheral artery disease.  Especially since his father has had to have stent placement for peripheral artery disease.  Discussed vascular studies with him today and he is agreeable to move forward with the studies. He also understands this could be secondary to his chronic condition in his lumbar spine.   Reviewed expectations re: course of current medical issues.  Discussed self-management of symptoms.  Outlined signs and symptoms indicating need for more acute intervention.  Patient verbalized understanding and all questions were answered.  Patient received an After-Visit Summary.    Orders Placed This Encounter  Procedures  . Ambulatory referral to  Orthopedic Surgery  . VAS [place] ABI WITH/WO TBI  . VAS [place] LOWER EXTREMITY ARTERIAL DUPLEX   No orders of the defined types were placed in this encounter.   Referral Orders     Ambulatory referral to Orthopedic Surgery   Note is dictated utilizing voice recognition software. Although note has been proof read prior to signing, occasional typographical errors still can be missed. If any questions arise, please do not hesitate to call for verification.   electronically signed by:  Felix Pacini, DO  [facility] - [place]

## 2020-10-26 NOTE — Patient Instructions (Addendum)
Dr. Kinnie Scales- gastroenterologist.  Contact us [phone]  Nice to see you today.  I will set up vascular studies and referral to ortho/neuro.

## 2020-10-31 ENCOUNTER — Ambulatory Visit (HOSPITAL_COMMUNITY)
Admission: RE | Admit: 2020-10-31 | Payer: 59 | Source: Ambulatory Visit | Attending: Family Medicine | Admitting: Family Medicine

## 2020-11-02 ENCOUNTER — Ambulatory Visit (HOSPITAL_COMMUNITY)
Admission: RE | Admit: 2020-11-02 | Discharge: 2020-11-02 | Disposition: A | Discharge status: Home / Self Care | Payer: 59 | Source: Ambulatory Visit | Attending: Cardiovascular Disease | Admitting: Cardiovascular Disease

## 2020-11-02 ENCOUNTER — Other Ambulatory Visit: Payer: Self-pay

## 2020-11-02 DIAGNOSIS — I739 Peripheral vascular disease, unspecified: Secondary | ICD-10-CM | POA: Insufficient documentation

## 2020-11-08 ENCOUNTER — Encounter: Payer: 59 | Admitting: Family Medicine

## 2020-11-30 ENCOUNTER — Encounter: Payer: 59 | Admitting: Family Medicine

## 2020-12-14 ENCOUNTER — Encounter: Payer: Self-pay | Admitting: Family Medicine

## 2020-12-14 ENCOUNTER — Ambulatory Visit (INDEPENDENT_AMBULATORY_CARE_PROVIDER_SITE_OTHER): Payer: 59 | Admitting: Family Medicine

## 2020-12-14 ENCOUNTER — Other Ambulatory Visit: Payer: Self-pay

## 2020-12-14 VITALS — BP 126/76 | HR 62 | Temp 98.1°F | Ht 65.0 in | Wt 155.0 lb

## 2020-12-14 DIAGNOSIS — Z130 Encounter for screening for diseases of the blood and blood-forming organs and certain disorders involving the immune mechanism: Secondary | ICD-10-CM | POA: Diagnosis not present

## 2020-12-14 DIAGNOSIS — Z131 Encounter for screening for diabetes mellitus: Secondary | ICD-10-CM | POA: Diagnosis not present

## 2020-12-14 DIAGNOSIS — Z13220 Encounter for screening for lipoid disorders: Secondary | ICD-10-CM

## 2020-12-14 DIAGNOSIS — Z125 Encounter for screening for malignant neoplasm of prostate: Secondary | ICD-10-CM

## 2020-12-14 DIAGNOSIS — D869 Sarcoidosis, unspecified: Secondary | ICD-10-CM | POA: Diagnosis not present

## 2020-12-14 DIAGNOSIS — Z23 Encounter for immunization: Secondary | ICD-10-CM

## 2020-12-14 DIAGNOSIS — Z0000 Encounter for general adult medical examination without abnormal findings: Secondary | ICD-10-CM

## 2020-12-14 DIAGNOSIS — G479 Sleep disorder, unspecified: Secondary | ICD-10-CM | POA: Insufficient documentation

## 2020-12-14 DIAGNOSIS — I77811 Abdominal aortic ectasia: Secondary | ICD-10-CM

## 2020-12-14 NOTE — Patient Instructions (Signed)
Great to see you today.     If labs were collected, we will inform you of lab results once received either by echart message or telephone call.   - echart message- for normal results that have been seen by the patient already.   - telephone call: abnormal results or if patient has not viewed results in their echart.   Health Maintenance, Male Adopting a healthy lifestyle and getting preventive care are important in promoting health and wellness. Ask your health care provider about:  The right schedule for you to have regular tests and exams.  Things you can do on your own to prevent diseases and keep yourself healthy. What should I know about diet, weight, and exercise? Eat a healthy diet  Eat a diet that includes plenty of vegetables, fruits, low-fat dairy products, and lean protein.  Do not eat a lot of foods that are high in solid fats, added sugars, or sodium.   Maintain a healthy weight Body mass index (BMI) is a measurement that can be used to identify possible weight problems. It estimates body fat based on height and weight. Your health care provider can help determine your BMI and help you achieve or maintain a healthy weight. Get regular exercise Get regular exercise. This is one of the most important things you can do for your health. Most adults should:  Exercise for at least 150 minutes each week. The exercise should increase your heart rate and make you sweat (moderate-intensity exercise).  Do strengthening exercises at least twice a week. This is in addition to the moderate-intensity exercise.  Spend less time sitting. Even light physical activity can be beneficial. Watch cholesterol and blood lipids Have your blood tested for lipids and cholesterol at 58 years of age, then have this test every 5 years. You may need to have your cholesterol levels checked more often if:  Your lipid or cholesterol levels are high.  You are older than 58 years of age.  You are at  high risk for heart disease. What should I know about cancer screening? Many types of cancers can be detected early and may often be prevented. Depending on your health history and family history, you may need to have cancer screening at various ages. This may include screening for:  Colorectal cancer.  Prostate cancer.  Skin cancer.  Lung cancer. What should I know about heart disease, diabetes, and high blood pressure? Blood pressure and heart disease  High blood pressure causes heart disease and increases the risk of stroke. This is more likely to develop in people who have high blood pressure readings, are of African descent, or are overweight.  Talk with your health care provider about your target blood pressure readings.  Have your blood pressure checked: ? Every 3-5 years if you are 79-54 years of age. ? Every year if you are 15 years old or older.  If you are between the ages of 70 and 73 and are a current or former smoker, ask your health care provider if you should have a one-time screening for abdominal aortic aneurysm (AAA). Diabetes Have regular diabetes screenings. This checks your fasting blood sugar level. Have the screening done:  Once every three years after age 64 if you are at a normal weight and have a low risk for diabetes.  More often and at a younger age if you are overweight or have a high risk for diabetes. What should I know about preventing infection? Hepatitis B If you have  a higher risk for hepatitis B, you should be screened for this virus. Talk with your health care provider to find out if you are at risk for hepatitis B infection. Hepatitis C Blood testing is recommended for:  Everyone born from [birth year] through 1963.  Anyone with known risk factors for hepatitis C. Sexually transmitted infections (STIs)  You should be screened each year for STIs, including gonorrhea and chlamydia, if: ? You are sexually active and are younger than 58 years of  age. ? You are older than 58 years of age and your health care provider tells you that you are at risk for this type of infection. ? Your sexual activity has changed since you were last screened, and you are at increased risk for chlamydia or gonorrhea. Ask your health care provider if you are at risk.  Ask your health care provider about whether you are at high risk for HIV. Your health care provider may recommend a prescription medicine to help prevent HIV infection. If you choose to take medicine to prevent HIV, you should first get tested for HIV. You should then be tested every 3 months for as long as you are taking the medicine. Follow these instructions at home: Lifestyle  Do not use any products that contain nicotine or tobacco, such as cigarettes, e-cigarettes, and chewing tobacco. If you need help quitting, ask your health care provider.  Do not use street drugs.  Do not share needles.  Ask your health care provider for help if you need support or information about quitting drugs. Alcohol use  Do not drink alcohol if your health care provider tells you not to drink.  If you drink alcohol: ? Limit how much you have to 0-2 drinks a day. ? Be aware of how much alcohol is in your drink. In the [place] one drink equals one 12 oz bottle of beer (355 mL), one 5 oz glass of wine (148 mL), or one 1 oz glass of hard liquor (44 mL). General instructions  Schedule regular health, dental, and eye exams.  Stay current with your vaccines.  Tell your health care provider if: ? You often feel depressed. ? You have ever been abused or do not feel safe at home. Summary  Adopting a healthy lifestyle and getting preventive care are important in promoting health and wellness.  Follow your health care provider's instructions about healthy diet, exercising, and getting tested or screened for diseases.  Follow your health care provider's instructions on monitoring your cholesterol and blood  pressure. This information is not intended to replace advice given to you by your health care provider. Make sure you discuss any questions you have with your health care provider. Document Revised: 07/07/2018 Document Reviewed: 07/07/2018 Elsevier Patient Education  2021 [employer].

## 2020-12-14 NOTE — Progress Notes (Signed)
This visit occurred during the SARS-CoV-2 public health emergency.  Safety protocols were in place, including screening questions prior to the visit, additional usage of staff PPE, and extensive cleaning of exam room while observing appropriate contact time as indicated for disinfecting solutions.    Patient ID: Christian Watson, male  DOB: [birth year], 60 y.o.   MRN: [id number] Patient Care Team    Relationship Specialty Notifications Start End  Natalia Leatherwood, DO PCP - General Family Medicine  10/11/18   Roslynn Amble, MD Consulting Physician Pulmonary Disease  06/24/17     Chief Complaint  Patient presents with  . Annual Exam    Pt is fasting    Subjective:  Christian Watson is a 60 y.o. male present for CPE. All past medical history, surgical history, allergies, family history, immunizations, medications and social history were updated in the electronic medical record today. All recent labs, ED visits and hospitalizations within the last year were reviewed.  Health maintenance:  Colonoscopy: Dr. Kinnie Scales 2015/2016- normal - 10 yr Immunizations:  tdap UTD 2016, influenza declined (encourage yearly), covid x3, Zoster #1 today. #2 by nurse visit 3 mos Infectious disease screening: HIV and Hep C completed PSA:  Lab Results  Component Value Date   PSA 0.50 12/14/2020   PSA 0.79 06/24/2017  , pt was counseled on prostate cancer screenings.  Assistive device: none Oxygen GGE:ZMOQ Patient has a Dental home. Hospitalizations/ED visits: reviewed  Sleep disturbance: Patient has noted the last 6 months he has difficulty with his sleeping pattern.  He will wake up few times throughout the night not be able to fall back asleep.  His activity has been the same and he still exercises pretty routinely.  Depression screen [facility] 2/9 12/14/2020 10/26/2020 02/05/2018 06/24/2017 06/03/2017  Decreased Interest 0 0 0 0 0  Down, Depressed, Hopeless 0 0 0 0 0  PHQ - 2 Score 0 0 0 0 0   No  flowsheet data found.       Fall Risk  06/24/2017  Falls in the past year? No      Immunization History  Administered Date(s) Administered  . PFIZER(Purple Top)SARS-COV-2 Vaccination 10/06/2019, 10/31/2019, 05/18/2020  . Tdap 04/03/2015  . Zoster Recombinat (Shingrix) 12/14/2020     Past Medical History:  Diagnosis Date  . Acute bursitis of left shoulder 11/16/2017  . Chicken pox   . Hip impingement syndrome, right 08/18/2017  . Sarcoidosis    Allergies  Allergen Reactions  . Augmentin [Amoxicillin-Pot Clavulanate] Diarrhea   Past Surgical History:  Procedure Laterality Date  . BRONCHOSCOPY     Bronchial Wash & Transbronchial Biopsies RML & RUL  . CARPAL TUNNEL RELEASE Left   . COLONOSCOPY    . cyst removed  as a child   from chin   Family History  Problem Relation Age of Onset  . Heart disease Maternal Grandmother   . Heart disease Maternal Grandfather   . Cancer Maternal Grandfather        unsure what kind  . Heart disease Paternal Grandmother   . Cancer Paternal Grandmother        unsure what kind  . Heart disease Paternal Grandfather   . Heart disease Mother   . Hypertension Father   . Diabetes Father   . Hearing loss Father   . Diabetes Sister   . Lung disease Neg Hx   . Sarcoidosis Neg Hx   . Rheumatologic disease Neg Hx    Social History  Social History Narrative   Originally from [place]. Has lived in [place], [place], & moved to [place] in 2005. 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 Has prior international travel to [place], [place], [place], [place], [place], [place], & [place]. Has a dog currently. No bird, mold, or hot tub exposure. Enjoys playing golf.    Married. College educated. Business owner.   Exercises routinely.   Takes a multivitamin, drinks caffeine, uses herbal remedies.   Occasional alcohol.   Wears a bicycle helmet. Has a smoke alarm in the home.  Wears his seatbelt.   Safe in his relationships.    Allergies as of 12/14/2020      Reactions   Augmentin [amoxicillin-pot Clavulanate] Diarrhea      Medication List       Accurate as of Dec 14, 2020 11:59 PM. If you have any questions, ask your nurse or doctor.        multivitamin tablet Take 1 tablet by mouth daily.      All past medical history, surgical history, allergies, family history, immunizations andmedications were updated in the EMR today and reviewed under the history and medication portions of their EMR.      ROS: 14 pt review of systems performed and negative (unless mentioned in an HPI)  Objective: BP 126/76   Pulse 62   Temp 98.1 F (36.7 C) (Oral)   Ht 5\' 5"  (1.651 m)   Wt 155 lb (70.3 kg)   SpO2 99%   BMI 25.79 kg/m  Gen: Afebrile. No acute distress. Nontoxic in appearance, well-developed, well-nourished, very pleasant male HENT: AT. [place]. Bilateral TM visualized and normal in appearance, normal external auditory canal. MMM, no oral lesions, adequate dentition. Bilateral nares within normal limit. Throat without erythema, ulcerations or exudates.  No cough on exam, no hoarseness on exam. Eyes:Pupils Equal Round Reactive to light, Extraocular movements intact,  Conjunctiva without redness, discharge or icterus. Neck/lymp/endocrine: Supple, no lymphadenopathy, no thyromegaly CV: RRR no murmur, no edema, +2/4 P posterior tibialis pulses.  Chest: CTAB, no wheeze, rhonchi or crackles.  Normal respiratory effort.  Good air movement. Abd: Soft.  Flat. NTND. BS present.  No masses palpated. No hepatosplenomegaly. No rebound tenderness or guarding. Skin: no rashes, purpura or petechiae. Warm and well-perfused. Skin intact. Neuro/Msk: Normal gait. PERLA. EOMi. Alert. Oriented x3.  Cranial nerves II through XII intact. Muscle strength 5/5 upper/lower extremity. DTRs equal bilaterally. Psych: Normal affect, dress and demeanor. Normal speech. Normal thought content and  judgment.  No exam data present  Assessment/plan: Christian Watson is a 60 y.o. male present for CPE/acute Sarcoidosis - Comprehensive metabolic panel Diabetes mellitus screening - Hemoglobin A1c Screening for deficiency anemia - CBC with Differential/Platelet Lipid screening - Lipid panel Prostate cancer screening - PSA Need for zoster vaccine - Varicella-zoster vaccine IM Sleep disturbance Discussed trying trazodone or Vistaril for him today.  We will wait on his lab results and then discuss further plan. - TSH Ectactic  abdominal aorta: 2.8 cm.  Repeat ultrasound due 2024.  Routine general medical examination at a health care facility Colonoscopy: Dr. 2025 2015/2016- normal - 10 yr Immunizations:  tdap UTD 2016, influenza declined (encourage yearly), covid x3, Zoster #1 today. #2 by nurse visit 3 mos Infectious disease screening: HIV and Hep C completed Patient was encouraged to exercise greater than 150 minutes a week. Patient was encouraged to choose a diet filled with fresh fruits  and vegetables, and lean meats. AVS provided to patient today for education/recommendation on gender specific health and safety maintenance. Zoster #1 today. #2 by nurse visit 3 mos  Return in about 1 year (around 12/16/2021) for CPE (30 min) and nurse visit 3 mos for shingrix #2.   Orders Placed This Encounter  Procedures  . Varicella-zoster vaccine IM  . CBC with Differential/Platelet  . Comprehensive metabolic panel  . Hemoglobin A1c  . Lipid panel  . PSA  . TSH   No orders of the defined types were placed in this encounter.  Referral Orders  No referral(s) requested today     Note is dictated utilizing voice recognition software. Although note has been proof read prior to signing, occasional typographical errors still can be missed. If any questions arise, please do not hesitate to call for verification.  Electronically signed by: Felix Pacini, DO [facility]  [place]

## 2020-12-15 LAB — TSH: TSH: 2.55 m[IU]/L (ref 0.40–4.50)

## 2020-12-15 LAB — CBC WITH DIFFERENTIAL/PLATELET
MPV: 9.8 fL (ref 7.5–12.5)
Monocytes Relative: 12.6 %
Neutro Abs: 2464 cells/uL (ref 1500–7800)
Neutrophils Relative %: 60.1 %
Platelets: 212 10*3/uL (ref 140–400)
RDW: 12.7 % (ref 11.0–15.0)

## 2020-12-17 ENCOUNTER — Telehealth: Payer: Self-pay | Admitting: Family Medicine

## 2020-12-17 DIAGNOSIS — I77811 Abdominal aortic ectasia: Secondary | ICD-10-CM

## 2020-12-17 HISTORY — DX: Abdominal aortic ectasia: I77.811

## 2020-12-17 MED ORDER — HYDROXYZINE HCL 10 MG PO TABS: 10.0000 mg | ORAL_TABLET | Freq: Every day | ORAL | 3 refills | Status: DC

## 2020-12-17 NOTE — Telephone Encounter (Signed)
Please call patient: His blood cell counts are normal A1c/diabetes screen is normal at 5.4 Thyroid function is normal Prostate cancer screening is normal  He does have a lab pending which is liver and kidney functions.  I will communicate this with him on his MyChart as long as it is normal.  I did go ahead and call in the medication we spoke up for his sleep disturbance.  It is called Vistaril or hydroxyzine and is taken about 1 hour before bed.  Start with 1 tab before bed, but can taper up to 3 tabs before bed.  These are all low doses for this medication but some people are more sensitive to others to the medications that we always start very low.  Medication has not working well enough for him, follow-up in 4-8 weeks so that we can discuss tapering further up or try another medication.

## 2020-12-18 ENCOUNTER — Telehealth: Payer: Self-pay | Admitting: Family Medicine

## 2020-12-18 DIAGNOSIS — I77811 Abdominal aortic ectasia: Secondary | ICD-10-CM

## 2020-12-18 DIAGNOSIS — Z8249 Family history of ischemic heart disease and other diseases of the circulatory system: Secondary | ICD-10-CM | POA: Insufficient documentation

## 2020-12-18 DIAGNOSIS — E785 Hyperlipidemia, unspecified: Secondary | ICD-10-CM | POA: Insufficient documentation

## 2020-12-18 LAB — COMPREHENSIVE METABOLIC PANEL
AG Ratio: 1.4 (calc) (ref 1.0–2.5)
ALT: 22 U/L (ref 9–46)
AST: 20 U/L (ref 10–35)
Albumin: 4.7 g/dL (ref 3.6–5.1)
Alkaline phosphatase (APISO): 64 U/L (ref 35–144)
BUN: 24 mg/dL (ref 7–25)
CO2: 21 mmol/L (ref 20–32)
Calcium: 10.8 mg/dL — ABNORMAL HIGH (ref 8.6–10.3)
Chloride: 107 mmol/L (ref 98–110)
Creat: 0.99 mg/dL (ref 0.70–1.33)
Globulin: 3.3 g/dL (calc) (ref 1.9–3.7)
Glucose, Bld: 113 mg/dL — ABNORMAL HIGH (ref 65–99)
Potassium: 5.2 mmol/L (ref 3.5–5.3)
Sodium: 146 mmol/L (ref 135–146)
Total Bilirubin: 0.4 mg/dL (ref 0.2–1.2)
Total Protein: 8 g/dL (ref 6.1–8.1)

## 2020-12-18 LAB — LIPID PANEL
Cholesterol: 251 mg/dL — ABNORMAL HIGH (ref <200)
HDL: 86 mg/dL (ref >=40)
LDL Cholesterol (Calc): 139 mg/dL (calc) — ABNORMAL HIGH
Non-HDL Cholesterol (Calc): 165 mg/dL (calc) — ABNORMAL HIGH (ref <130)
Total CHOL/HDL Ratio: 2.9 (calc) (ref <5.0)
Triglycerides: 137 mg/dL (ref <150)

## 2020-12-18 LAB — CBC WITH DIFFERENTIAL/PLATELET
Absolute Monocytes: 517 cells/uL (ref 200–950)
Basophils Absolute: 21 cells/uL (ref 0–200)
Basophils Relative: 0.5 %
Eosinophils Absolute: 57 cells/uL (ref 15–500)
Eosinophils Relative: 1.4 %
HCT: 42.3 % (ref 38.5–50.0)
Hemoglobin: 14 g/dL (ref 13.2–17.1)
Lymphs Abs: 1041 cells/uL (ref 850–3900)
MCH: 32.2 pg (ref 27.0–33.0)
MCHC: 33.1 g/dL (ref 32.0–36.0)
MCV: 97.2 fL (ref 80.0–100.0)
RBC: 4.35 10*6/uL (ref 4.20–5.80)
Total Lymphocyte: 25.4 %
WBC: 4.1 10*3/uL (ref 3.8–10.8)

## 2020-12-18 LAB — PSA: PSA: 0.5 ng/mL (ref <=4.00)

## 2020-12-18 LAB — HEMOGLOBIN A1C
Hgb A1c MFr Bld: 5.4 % of total Hgb (ref <5.7)
Mean Plasma Glucose: 108 mg/dL
eAG (mmol/L): 6 mmol/L

## 2020-12-18 MED ORDER — ATORVASTATIN CALCIUM 20 MG PO TABS: 20.0000 mg | ORAL_TABLET | Freq: Every evening | ORAL | 3 refills | Status: DC

## 2020-12-18 NOTE — Addendum Note (Signed)
Addended by: Felix Pacini A on: 12/18/2020 02:13 PM   Modules accepted: Orders

## 2020-12-18 NOTE — Telephone Encounter (Signed)
Spoke with pt regarding labs and instructions. Pt agrees to rx

## 2020-12-18 NOTE — Telephone Encounter (Signed)
Spoke with pt regarding labs and instructions.   

## 2020-12-18 NOTE — Telephone Encounter (Signed)
Prescribed lipitor

## 2020-12-18 NOTE — Telephone Encounter (Signed)
Please call patient Liver, kidney and thyroid function are normal Blood cell counts normal. Diabetes screening/A1c is normal  Prostate cancer screening is normal  Electrolytes are normal with a mildly elevated calcium, possibly related to his sarcoid.  I would encourage him to hydrate, increase his water consumption which is the first-line treatment for mildly elevated calcium.  This level is 10.8, 10.3 is normal.  A few years ago he had been up to 10.6.  His HDL/good cholesterol looks excellent at 86.  His bad cholesterol/LDL is mildly above goal for his age, gender and family history.  This does put him at 5.3% higher risk than normal population for heart attack or stroke in the next 10 years.  By American heart association criteria, he would benefit from starting a statin medication to help provide cardiovascular protection and help lower his cholesterol with an LDL goal at least less than 100.  He is agreeable to start medication I will call this in for him and we would need to follow-up in 3 months after statin start, then yearly thereafter.  Please advise

## 2021-03-06 ENCOUNTER — Ambulatory Visit: Payer: 59 | Admitting: Family Medicine

## 2021-03-06 ENCOUNTER — Ambulatory Visit: Payer: 59

## 2021-03-06 ENCOUNTER — Other Ambulatory Visit: Payer: Self-pay

## 2021-03-06 ENCOUNTER — Encounter: Payer: Self-pay | Admitting: Family Medicine

## 2021-03-06 VITALS — BP 116/72 | HR 63 | Temp 98.2°F | Wt 149.0 lb

## 2021-03-06 DIAGNOSIS — K625 Hemorrhage of anus and rectum: Secondary | ICD-10-CM | POA: Diagnosis not present

## 2021-03-06 DIAGNOSIS — Z8249 Family history of ischemic heart disease and other diseases of the circulatory system: Secondary | ICD-10-CM

## 2021-03-06 DIAGNOSIS — Z23 Encounter for immunization: Secondary | ICD-10-CM

## 2021-03-06 DIAGNOSIS — E785 Hyperlipidemia, unspecified: Secondary | ICD-10-CM | POA: Diagnosis not present

## 2021-03-06 LAB — COMPREHENSIVE METABOLIC PANEL
ALT: 38 U/L (ref 0–53)
AST: 37 U/L (ref 0–37)
Albumin: 4.5 g/dL (ref 3.5–5.2)
Alkaline Phosphatase: 62 U/L (ref 39–117)
BUN: 12 mg/dL (ref 6–23)
CO2: 28 mEq/L (ref 19–32)
Calcium: 10.3 mg/dL (ref 8.4–10.5)
Chloride: 102 mEq/L (ref 96–112)
Creatinine, Ser: 0.94 mg/dL (ref 0.40–1.50)
GFR: 89.63 mL/min (ref >60.00)
Glucose, Bld: 95 mg/dL (ref 70–99)
Potassium: 4.4 mEq/L (ref 3.5–5.1)
Sodium: 141 mEq/L (ref 135–145)
Total Bilirubin: 0.6 mg/dL (ref 0.2–1.2)
Total Protein: 7.1 g/dL (ref 6.0–8.3)

## 2021-03-06 LAB — LIPID PANEL
Cholesterol: 148 mg/dL (ref 0–200)
HDL: 76.2 mg/dL (ref >39.00)
LDL Cholesterol: 49 mg/dL (ref 0–99)
NonHDL: 71.72
Total CHOL/HDL Ratio: 2
Triglycerides: 112 mg/dL (ref 0.0–149.0)
VLDL: 22.4 mg/dL (ref 0.0–40.0)

## 2021-03-06 LAB — CBC WITH DIFFERENTIAL/PLATELET
Basophils Absolute: 0 10*3/uL (ref 0.0–0.1)
Basophils Relative: 0.5 % (ref 0.0–3.0)
Eosinophils Absolute: 0 10*3/uL (ref 0.0–0.7)
Eosinophils Relative: 1 % (ref 0.0–5.0)
HCT: 38.8 % — ABNORMAL LOW (ref 39.0–52.0)
Hemoglobin: 13 g/dL (ref 13.0–17.0)
Lymphocytes Relative: 25.5 % (ref 12.0–46.0)
Lymphs Abs: 0.8 10*3/uL (ref 0.7–4.0)
MCHC: 33.6 g/dL (ref 30.0–36.0)
MCV: 94.3 fl (ref 78.0–100.0)
Monocytes Absolute: 0.3 10*3/uL (ref 0.1–1.0)
Monocytes Relative: 8.4 % (ref 3.0–12.0)
Neutro Abs: 2.1 10*3/uL (ref 1.4–7.7)
Neutrophils Relative %: 64.6 % (ref 43.0–77.0)
Platelets: 214 10*3/uL (ref 150.0–400.0)
RBC: 4.11 Mil/uL — ABNORMAL LOW (ref 4.22–5.81)
RDW: 13.3 % (ref 11.5–15.5)
WBC: 3.3 10*3/uL — ABNORMAL LOW (ref 4.0–10.5)

## 2021-03-06 NOTE — Patient Instructions (Signed)
Coronary Calcium Scan A coronary calcium scan is an imaging test used to look for deposits of plaque in the inner lining of the blood vessels of the heart (coronary arteries). Plaque is made up of calcium, protein, and fatty substances. These deposits of plaque can partly clog and narrow the coronary arteries without producingany symptoms or warning signs. This puts a person at risk for a heart attack. This test is recommended for people who are at moderate risk for heart disease.The test can find plaque deposits before symptoms develop. Tell a health care provider about: Any allergies you have. All medicines you are taking, including vitamins, herbs, eye drops, creams, and over-the-counter medicines. Any problems you or family members have had with anesthetic medicines. Any blood disorders you have. Any surgeries you have had. Any medical conditions you have. Whether you are pregnant or may be pregnant. What happens before the procedure? Ask your health care provider for any specific instructions on how to prepare for this procedure. You may be asked to avoid products that contain caffeine,tobacco, or nicotine for 4 hours before the procedure. What happens during the procedure?  You will undress and remove any jewelry from your neck or chest. You will put on a hospital gown. Sticky electrodes will be placed on your chest. The electrodes will be connected to an electrocardiogram (ECG) machine to record a tracing of the electrical activity of your heart. You will lie down on a curved bed that is attached to the CT scanner. You may be given medicine to slow down your heart rate so that clear pictures can be created. You will be moved into the CT scanner, and the CT scanner will take pictures of your heart. During this time, you will be asked to lie still and hold your breath for 2-3 seconds at a time while each picture of your heart is being taken. The procedure may vary among health care  providers and hospitals. What happens after the procedure? You can get dressed. You can return to your normal activities. It is up to you to get the results of your procedure. Ask your health care provider, or the department that is doing the procedure, when your results will be ready. Summary A coronary calcium scan is an imaging test used to look for deposits of plaque in the inner lining of the blood vessels of the heart (coronary arteries). Plaque is made up of calcium, protein, and fatty substances. Generally, this is a safe procedure. Tell your health care provider if you are pregnant or may be pregnant. Ask your health care provider for any specific instructions on how to prepare for this procedure. A CT scanner will take pictures of your heart. You can return to your normal activities after the scan is done. This information is not intended to replace advice given to you by your health care provider. Make sure you discuss any questions you have with your healthcare provider. Document Revised: 02/01/2019 Document Reviewed: 02/01/2019 Elsevier Patient Education  2022 [employer].

## 2021-03-06 NOTE — Progress Notes (Signed)
This visit occurred during the SARS-CoV-2 public health emergency.  Safety protocols were in place, including screening questions prior to the visit, additional usage of staff PPE, and extensive cleaning of exam room while observing appropriate contact time as indicated for disinfecting solutions.    Christian Watson , 1961-02-21, 60 y.o., male MRN: [id number] Patient Care Team    Relationship Specialty Notifications Start End  Natalia Leatherwood, DO PCP - General Family Medicine  10/11/18   Roslynn Amble, MD Consulting Physician Pulmonary Disease  06/24/17     Chief Complaint  Patient presents with   Hyperlipidemia    Pt is not fasting     Subjective: Pt presents for an OV to follow up on hyperlipidemia and start of lipitor. He reports he is tolerating Lipitor without complaints.Body mass index is 24.79 kg/m. Physically fit, works out routinely and eats a healthy diet. Strong Fhx of heart disease. Patient denies chest pain, shortness of breath, dizziness or lower extremity edema.    Depression screen [facility] 2/9 12/14/2020 10/26/2020 02/05/2018 06/24/2017 06/03/2017  Decreased Interest 0 0 0 0 0  Down, Depressed, Hopeless 0 0 0 0 0  PHQ - 2 Score 0 0 0 0 0    Allergies  Allergen Reactions   Augmentin [Amoxicillin-Pot Clavulanate] Diarrhea   Social History   Social History Narrative   Originally from [place]. Has lived in [place], [place], & moved to [place] in 2005. 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 Has prior international travel to [place], [place], [place], [place], [place], [place], & [place]. Has a dog currently. No bird, mold, or hot tub exposure. Enjoys playing golf.    Married. College educated. Business owner.   Exercises routinely.   Takes a multivitamin, drinks caffeine, uses herbal remedies.   Occasional alcohol.   Wears a bicycle helmet. Has a smoke alarm in the home. Wears his  seatbelt.   Safe in his relationships.   Past Medical History:  Diagnosis Date   Acute bursitis of left shoulder 11/16/2017   Chicken pox    Hip impingement syndrome, right 08/18/2017   Sarcoidosis    Past Surgical History:  Procedure Laterality Date   BRONCHOSCOPY     Bronchial Wash & Transbronchial Biopsies RML & RUL   CARPAL TUNNEL RELEASE Left    COLONOSCOPY     cyst removed  as a child   from chin   Family History  Problem Relation Age of Onset   Heart disease Maternal Grandmother    Heart disease Maternal Grandfather    Cancer Maternal Grandfather        unsure what kind   Heart disease Paternal Grandmother    Cancer Paternal Grandmother        unsure what kind   Heart disease Paternal Grandfather    Heart disease Mother    Hypertension Father    Diabetes Father    Hearing loss Father    Diabetes Sister    Lung disease Neg Hx    Sarcoidosis Neg Hx    Rheumatologic disease Neg Hx    Allergies as of 03/06/2021       Reactions   Augmentin [amoxicillin-pot Clavulanate] Diarrhea        Medication List        Accurate as of March 06, 2021  9:25 AM. If you have any questions, ask your nurse or doctor.  atorvastatin 20 MG tablet Commonly known as: LIPITOR Take 1 tablet (20 mg total) by mouth every evening.   hydrOXYzine 10 MG tablet Commonly known as: ATARAX/VISTARIL Take 1-3 tablets (10-30 mg total) by mouth at bedtime.   multivitamin tablet Take 1 tablet by mouth daily.        All past medical history, surgical history, allergies, family history, immunizations andmedications were updated in the EMR today and reviewed under the history and medication portions of their EMR.     ROS: Negative, with the exception of above mentioned in HPI   Objective:  BP 116/72   Pulse 63   Temp 98.2 F (36.8 C) (Oral)   Wt 149 lb (67.6 kg)   SpO2 98%   BMI 24.79 kg/m  Body mass index is 24.79 kg/m. Gen: Afebrile. No acute distress. Nontoxic in  appearance, well developed, well nourished. Physically fit male.  HENT: AT. [place].  Eyes:Pupils Equal Round Reactive to light, Extraocular movements intact,  Conjunctiva without redness, discharge or icterus. CV: RRR no murmur, no edema Chest: CTAB, no wheeze or crackles. Good air movement, normal resp effort.  Abd: Soft. NTND. BS present.  Skin: no rashes, purpura or petechiae.  Neuro:  Normal gait. PERLA. EOMi. Alert. Oriented x3 Psych: Normal affect, dress and demeanor. Normal speech. Normal thought content and judgment.  No results found. No results found. No results found for this or any previous visit (from the past 24 hour(s)).  Assessment/Plan: TRAYDEN BRANDY is a 60 y.o. male present for OV for  Hyperlipidemia LDL goal <100/Family history of heart disease Continue lipitor 20 mg qhs Discussed cardiac CT eval. If desired. He will think about it. If he decides to have completed he is aware it is $99 out of pocket cost. I would be happy to order this for him without needing 2nd appt since we discussed in detail today.  - continue routine exercise and heart healthy diet - Comprehensive metabolic panel - Lipid panel Follow up at cpe yearly  Rectal bleeding Has been evaluated by GI yesterday. Has had rectal bleeding for a few weeks. They plan colonoscopy in Dec. With hemorrhoid banding at same time if the cause.  - CBC w/Diff  Shingrix #2 completed today to complete series.   Reviewed expectations re: course of current medical issues. Discussed self-management of symptoms. Outlined signs and symptoms indicating need for more acute intervention. Patient verbalized understanding and all questions were answered. Patient received an After-Visit Summary.    Orders Placed This Encounter  Procedures   Varicella-zoster vaccine IM   Comprehensive metabolic panel   Lipid panel   CBC w/Diff   No orders of the defined types were placed in this encounter.  Referral Orders  No  referral(s) requested today     Note is dictated utilizing voice recognition software. Although note has been proof read prior to signing, occasional typographical errors still can be missed. If any questions arise, please do not hesitate to call for verification.   electronically signed by:  Felix Pacini, DO  [facility] [place]

## 2021-03-18 ENCOUNTER — Telehealth: Payer: Self-pay

## 2021-03-18 NOTE — Telephone Encounter (Signed)
[facility] Night - Client TELEPHONE ADVICE RECORD [employer] Patient Name: Christian Watson Gender: Male DOB: 1961-06-01 Age: 60 Y 1 M 4 D Return Phone Number: [phone] (Primary) Address:City/State/Zip: [address] [place] [address] Client [facility] Night - Client Client Site [facility] Night Physician Claiborne [place], [place] Contact Type Call Who Is Calling Patient / Member / Family / Caregiver Call Type Triage / Clinical Relationship To Patient Self Return Phone Number [phone] (Primary) Chief Complaint Headache Reason for Call Symptomatic / Request for Health Information Initial Comment Caller states he just tested positive for COVID. He has a headache, runny nose, cough, body aches, and a fever of 102.7. He is out of town. Translation No Nurse Assessment Nurse: Edward Jolly, RN, Drema Pry Date/Time Lamount Cohen Time): 03/16/2021 11:37:54 AM Confirm and document reason for call. If symptomatic, describe symptoms. ---Caller states he just tested positive for COVID. He has a headache, runny nose, cough, body aches, and a fever of 102.7. He is out of town. sympt started last night Does the patient have any new or worsening symptoms? ---Yes Will a triage be completed? ---Yes Related visit to physician within the last 2 weeks? ---No Does the PT have any chronic conditions? (i.e. diabetes, asthma, this includes High risk factors for pregnancy, etc.) ---No Is this a behavioral health or substance abuse call? ---No Guidelines Guideline Title Affirmed Question Affirmed Notes Nurse Date/Time (Eastern Time) COVID-19 - Diagnosed or Suspected Chest pain or pressure Edward Jolly, RN, Drema Pry 03/16/2021 11:38:42 AM Disp. Time Lamount Cohen Time) Disposition Final User 03/16/2021 11:29:27 AM Send To RN Personal Jiles Garter 03/16/2021 11:38:54 AM Go to ED Now (or PCP triage) Yes Edward Jolly, RN, Drema Pry PLEASE NOTE: All timestamps contained within this  report are represented as [place] Standard Time. CONFIDENTIALTY NOTICE: This fax transmission is intended only for the addressee. It contains information that is legally privileged, confidential or otherwise protected from use or disclosure. If you are not the intended recipient, you are strictly prohibited from reviewing, disclosing, copying using or disseminating any of this information or taking any action in reliance on or regarding this information. If you have received this fax in error, please notify us immediately by telephone so that we can arrange for its return to [place]. Phone: [phone], Toll-Free: [phone], Fax: [phone] Page: 2 of 2 Call Id: [id number] Caller Disagree/Comply Comply Caller Understands Yes PreDisposition Call Doctor Care Advice Given Per Guideline GO TO ED NOW (OR PCP TRIAGE): CARE ADVICE given per COVID-19 - DIAGNOSED OR SUSPECTED (Adult) guideline. Comments User: Cannon Kettle, RN Date/Time Lamount Cohen Time): 03/16/2021 11:41:12 AM initial triage call was done under (day triage chart [id number]) PC changed it to the right client site. Pt requesting paxlovid, adivsed him to be seen, meds are not called in after hours unless its one of the standing order meds. Caller verbalized understanding. Referrals GO TO FACILITY REFUSED

## 2021-03-18 NOTE — Telephone Encounter (Signed)
Spoke with pt regarding call received from triage. Pt stated that he has already seen and treated.

## 2021-05-23 LAB — HM COLONOSCOPY

## 2021-05-27 NOTE — Progress Notes (Unsigned)
o

## 2021-06-11 ENCOUNTER — Encounter: Payer: Self-pay | Admitting: Internal Medicine

## 2021-06-11 ENCOUNTER — Other Ambulatory Visit: Payer: Self-pay

## 2021-06-11 ENCOUNTER — Ambulatory Visit: Payer: 59 | Admitting: Internal Medicine

## 2021-06-11 VITALS — BP 126/74 | HR 71 | Temp 98.5°F | Ht 66.0 in | Wt 162.8 lb

## 2021-06-11 DIAGNOSIS — D860 Sarcoidosis of lung: Secondary | ICD-10-CM

## 2021-06-11 NOTE — Progress Notes (Signed)
SIXTO BOWDISH    [id number]    1960/11/29  Primary Care Physician:Kuneff, Ezequiel Essex, DO Date of Appointment: 06/11/2021 New Patient Evaluation, Self Referred  Chief complaint:   Chief Complaint  Patient presents with   Follow-up    Patient says he think his sarcoidosis is messing with his lymph nodes     HPI: Christian Watson is a 60 y.o. man with a history of biopsy proven pulmonary sarcoidosis. He was previously seen by Dr. Jamison Neighbor and Dr. Shelle Iron. Was treated with prednisone for a few months. His breathing issues resolved. Over the last ten years once or twice he had a persistent cough which resolved with prednisone.   Denies any dyspnea or cough.  Independent with ADLs.   His vision has gotten worse with worsening presbyopia. He has an eye exam scheduled in 2 weeks. He denies painful vision.   He does have some kidney stones noted on imaging as an incidental finding while undergoing work up for a muscle strain, but hasn't passed any.   Denies palpitations or syncope.   No skin changes or joint pains.   Notes some swollen lymph nodes which are non painful.  This has been present for the last 2 weeks. Denies any recent URIs.  no fevers, chills, night sweats, weight loss. He has been feeling less hungry the last two weeks.   Social History:  Occupation: owns a Therapist, sports of products.  Exposures: originally from [place], has lived in [place] the last 16 years Smoking history: never smoker, passive smoke exposure   Social History   Occupational History   Occupation: Pharmacologist: [employer]  Tobacco Use   Smoking status: Never   Smokeless tobacco: Never   Tobacco comments:    Second-hand from parents   Vaping Use   Vaping Use: Never used  Substance and Sexual Activity   Alcohol use: Yes    Comment: glass of wine with dinner   Drug use: No   Sexual activity: Yes    Partners: Female     Comment: Married    Relevant family history:  Family History  Problem Relation Age of Onset   Heart disease Maternal Grandmother    Heart disease Maternal Grandfather    Cancer Maternal Grandfather        unsure what kind   Heart disease Paternal Grandmother    Cancer Paternal Grandmother        unsure what kind   Heart disease Paternal Grandfather    Heart disease Mother    Hypertension Father    Diabetes Father    Hearing loss Father    Diabetes Sister    Lung disease Neg Hx    Sarcoidosis Neg Hx    Rheumatologic disease Neg Hx     Past Medical History:  Diagnosis Date   Acute bursitis of left shoulder 11/16/2017   Chicken pox    Hip impingement syndrome, right 08/18/2017   Sarcoidosis     Past Surgical History:  Procedure Laterality Date   BRONCHOSCOPY     Bronchial Wash & Transbronchial Biopsies RML & RUL   CARPAL TUNNEL RELEASE Left    COLONOSCOPY     cyst removed  as a child   from chin    Physical Exam: Blood pressure 126/74, pulse 71, temperature 98.5 F (36.9 C), temperature source Oral, height 5\' 6"  (1.676 m), weight 162 lb 12.8  oz (73.8 kg), SpO2 98 %. Gen:      No acute distress ENT:  no nasal polyps, mucus membranes moist, mild swollen, non tender lymphadenopathy Lungs:    No increased respiratory effort, symmetric chest wall excursion, clear to auscultation bilaterally, no wheezes or crackles CV:         Regular rate and rhythm; no murmurs, rubs, or gallops.  No pedal edema MSK: no acute synovitis of DIP or PIP joints, no mechanics hands.  Skin:      Warm and dry; no rashes Neuro: normal speech, no focal facial asymmetry Psych: alert and oriented x3, normal mood and affect  Data Reviewed/Medical Decision Making:  Independent interpretation of tests: Imaging:  Review of patient's CT Chest  images 2012 revealed peribronchovascular nodules with hilar adenopathy and subpleural nodules along diaphragm. There are also splenic  The patient's images have  been independently reviewed by me.    PFTs: I have personally reviewed the patient's PFTs and normal pulmonary function in May 2014.  No flowsheet data found.  Labs: Transbronchial biospsies 2012 show non-caseating granulomas with multinucleated giant cells.   Immunization status:  Immunization History  Administered Date(s) Administered   PFIZER(Purple Top)SARS-COV-2 Vaccination 10/06/2019, 10/31/2019, 05/18/2020   Tdap 04/03/2015   Zoster Recombinat (Shingrix) 12/14/2020, 03/06/2021     I reviewed prior external note(s) from Dr Jamison Neighbor  I reviewed the result(s) of the labs and imaging as noted above.   I have ordered PFTs   Assessment:  Pulmonary Sarcoidosis Cervical adenopathy  Plan/Recommendations:  His cervical adenopathy has been present for the last 2 weeks and he feels is smaller now than it was two weeks ago. In the absence of constitutional symptoms and known sarcoid diagnosis this is most likely reactive lymphadenopathy.  He is up to date on age appropriate cancer screening and had colonoscopy recently.  If this is persistent over the next 2-3 months can consider additional work up including neck and chest imaging or ultrasound with potential for biopsy.   For the diagnosis of sarcoidosis I have reviewed  the following studies today: I have ordered PFTs since he hasn't had any done since 2014. He had labs done in August 2022 as well as Calcium level done which was wnl.   Baseline eye examination to evaluate for ocular sarcoidosis - he is due for an eye exam in the next couple weeks and is scheduled.  Baseline serum creatinine for renal sarcoidosis - reviewed and wnl Baseline serum alkaline phosphatase for hepatic sarcoidosis - reviewed and wnl Baseline serum calcium - reviewed and wno Baseline serum complete blood count - reviewed and wnl Baseline EKG for cardiac sarcoidosis - reviewed EKG 2018 which shows sinus bradycardia.   Reference: Diagnosis and Detection of  Sarcoidosis: An [employer] Clinical Practice Guideline Am J Respir Crit Care Med Vol 201, Iss 8, pp e26-e51, Nov 10, 2018   Declined flu shot today.   We discussed disease management and progression at length today.    Return in about 1 year (around 06/11/2022).   Durel Salts, MD Pulmonary and Critical Care Medicine [facility] [phone]

## 2021-06-11 NOTE — Patient Instructions (Signed)
Please schedule follow up scheduled with myself in 12 months.  If my schedule is not open yet, we will contact you with a reminder closer to that time.  Before your next visit I would like you to have: Full set of PFTs - 1 hour.   I will contact you with the results.   Let me know if they lymph node swelling is not improving over the next 6-8 weeks and we can pursue additional imaging.

## 2021-07-30 ENCOUNTER — Ambulatory Visit (INDEPENDENT_AMBULATORY_CARE_PROVIDER_SITE_OTHER): Payer: 59 | Admitting: Internal Medicine

## 2021-07-30 ENCOUNTER — Other Ambulatory Visit: Payer: Self-pay

## 2021-07-30 DIAGNOSIS — D860 Sarcoidosis of lung: Secondary | ICD-10-CM

## 2021-07-30 NOTE — Progress Notes (Signed)
Full PFT completed today ? ?

## 2021-08-05 LAB — PULMONARY FUNCTION TEST
DL/VA % pred: 101 %
DL/VA: 4.42 ml/min/mmHg/L
DLCO cor % pred: 89 %
DLCO cor: 21.96 ml/min/mmHg
DLCO unc % pred: 89 %
DLCO unc: 21.96 ml/min/mmHg
FEF 25-75 Post: 3.01 L/sec
FEF 25-75 Pre: 2.97 L/sec
FEF2575-%Change-Post: 1 %
FEF2575-%Pred-Post: 111 %
FEF2575-%Pred-Pre: 109 %
FEV1-%Change-Post: 1 %
FEV1-%Pred-Post: 94 %
FEV1-%Pred-Pre: 93 %
FEV1-Post: 3.01 L
FEV1-Pre: 2.96 L
FEV1FVC-%Change-Post: 5 %
FEV1FVC-%Pred-Pre: 104 %
FEV6-%Change-Post: -2 %
FEV6-%Pred-Post: 90 %
FEV6-%Pred-Pre: 92 %
FEV6-Post: 3.58 L
FEV6-Pre: 3.68 L
FEV6FVC-%Change-Post: 0 %
FEV6FVC-%Pred-Post: 104 %
FEV6FVC-%Pred-Pre: 103 %
FVC-%Change-Post: -3 %
FVC-%Pred-Post: 85 %
FVC-%Pred-Pre: 88 %
FVC-Post: 3.59 L
Post FEV1/FVC ratio: 84 %
Post FEV6/FVC ratio: 100 %
Pre FEV1/FVC ratio: 80 %
Pre FEV6/FVC Ratio: 99 %
RV % pred: 57 %
RV: 1.13 L
TLC % pred: 78 %
TLC: 4.82 L

## 2021-11-12 ENCOUNTER — Encounter: Payer: Self-pay | Admitting: Family Medicine

## 2021-11-12 ENCOUNTER — Ambulatory Visit (INDEPENDENT_AMBULATORY_CARE_PROVIDER_SITE_OTHER): Payer: BC Managed Care – PPO | Admitting: Family Medicine

## 2021-11-12 VITALS — BP 134/72 | HR 75 | Temp 98.0°F | Ht 65.0 in | Wt 157.0 lb

## 2021-11-12 DIAGNOSIS — J0100 Acute maxillary sinusitis, unspecified: Secondary | ICD-10-CM

## 2021-11-12 DIAGNOSIS — R051 Acute cough: Secondary | ICD-10-CM | POA: Diagnosis not present

## 2021-11-12 MED ORDER — HYDROCODONE BIT-HOMATROP MBR 5-1.5 MG/5ML PO SOLN: 5.0000 mL | Freq: Three times a day (TID) | ORAL | 0 refills | Status: DC | PRN

## 2021-11-12 MED ORDER — DOXYCYCLINE HYCLATE 100 MG PO TABS: 100.0000 mg | ORAL_TABLET | Freq: Two times a day (BID) | ORAL | 0 refills | Status: DC

## 2021-11-12 NOTE — Patient Instructions (Signed)
Rest. Hydrate.  ?Mucinex DM.  ?Flonase nasal spray daily.  ? ?I have called hycodan cough syrup and doxycyline.  ?Star antibiotic if symptoms not improving or rebound. ? ? ? ? ? ? ? ?

## 2021-11-12 NOTE — Progress Notes (Signed)
? ? ? ?This visit occurred during the SARS-CoV-2 public health emergency.  Safety protocols were in place, including screening questions prior to the visit, additional usage of staff PPE, and extensive cleaning of exam room while observing appropriate contact time as indicated for disinfecting solutions.  ? ? ?Christian Watson , May 16, 1961, 61 y.o., male ?MRN: [id number] ?Patient Care Team  ?  Relationship Specialty Notifications Start End  ?Natalia Leatherwood, DO PCP - General Family Medicine  10/11/18   ?Roslynn Amble, MD Consulting Physician Pulmonary Disease  06/24/17   ? ? ?Chief Complaint  ?Patient presents with  ? Cough  ?  Pt c/o productive cough, nasal  drainage, chest tightness x 1 week; pt tested neg for covid sat  ? ?  ?Subjective: Pt presents for an OV with complaints of productive cough that is keeping him up at night, nasal drainage, chest discomfort with cough for 1 weeks duration.  He was in [place] visiting family and became ill.  He endorses mild chills, but denies fever.  He is tolerating p.o., but has a decreased appetite.  He endorses mild diarrhea.  They have negative COVID test on Saturday.  He is taking Mucinex DM. ? ? ?  11/12/2021  ?  9:55 AM 12/14/2020  ?  9:46 AM 10/26/2020  ? 11:29 AM 02/05/2018  ?  7:12 PM 06/24/2017  ?  8:23 AM  ?Depression screen PHQ 2/9  ?Decreased Interest 0 0 0 0 0  ?Down, Depressed, Hopeless 0 0 0 0 0  ?PHQ - 2 Score 0 0 0 0 0  ? ? ?Allergies  ?Allergen Reactions  ? Augmentin [Amoxicillin-Pot Clavulanate] Diarrhea  ? ?Social History  ? ?Social History Narrative  ? Originally from [place]. Has lived in [place], [place], & moved to [place] in 2005. 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 Has prior international travel to [place], [place], [place], [place], [place], [place], & [place]. Has a dog currently. No bird, mold, or hot tub exposure. Enjoys playing golf.   ? Married. College educated.  Business owner.  ? Exercises routinely.  ? Takes a multivitamin, drinks caffeine, uses herbal remedies.  ? Occasional alcohol.  ? Wears a bicycle helmet. Has a smoke alarm in the home. Wears his seatbelt.  ? Safe in his relationships.  ? ?Past Medical History:  ?Diagnosis Date  ? Acute bursitis of left shoulder 11/16/2017  ? Chicken pox   ? Hip impingement syndrome, right 08/18/2017  ? Sarcoidosis   ? ?Past Surgical History:  ?Procedure Laterality Date  ? BRONCHOSCOPY    ? Bronchial Wash & Transbronchial Biopsies RML & RUL  ? CARPAL TUNNEL RELEASE Left   ? COLONOSCOPY    ? cyst removed  as a child  ? from chin  ? ?Family History  ?Problem Relation Age of Onset  ? Heart disease Maternal Grandmother   ? Heart disease Maternal Grandfather   ? Cancer Maternal Grandfather   ?     unsure what kind  ? Heart disease Paternal Grandmother   ? Cancer Paternal Grandmother   ?     unsure what kind  ? Heart disease Paternal Grandfather   ? Heart disease Mother   ? Hypertension Father   ? Diabetes Father   ? Hearing loss Father   ? Diabetes Sister   ? Lung disease Neg Hx   ? Sarcoidosis Neg Hx   ? Rheumatologic disease Neg  Hx   ? ?Allergies as of 11/12/2021   ? ?   Reactions  ? Augmentin [amoxicillin-pot Clavulanate] Diarrhea  ? ?  ? ?  ?Medication List  ?  ? ?  ? Accurate as of November 12, 2021 12:24 PM. If you have any questions, ask your nurse or doctor.  ?  ?  ? ?  ? ?atorvastatin 20 MG tablet ?Commonly known as: LIPITOR ?Take 1 tablet (20 mg total) by mouth every evening. ?  ?doxycycline 100 MG tablet ?Commonly known as: VIBRA-TABS ?Take 1 tablet (100 mg total) by mouth 2 (two) times daily. ?Started by: Felix Pacinienee Josephanthony Tindel, DO ?  ?HYDROcodone bit-homatropine 5-1.5 MG/5ML syrup ?Commonly known as: HYCODAN ?Take 5 mLs by mouth every 8 (eight) hours as needed for cough. ?Started by: Felix Pacinienee Montgomery Favor, DO ?  ?hydrOXYzine 10 MG tablet ?Commonly known as: ATARAX ?Take 1-3 tablets (10-30 mg total) by mouth at bedtime. ?  ?multivitamin tablet ?Take 1  tablet by mouth daily. ?  ? ?  ? ? ?All past medical history, surgical history, allergies, family history, immunizations andmedications were updated in the EMR today and reviewed under the history and medication portions of their EMR.    ? ?ROS ?Negative, with the exception of above mentioned in HPI ? ? ?Objective:  ?BP 134/72   Pulse 75   Temp 98 ?F (36.7 ?C) (Oral)   Ht 5\' 5"  (1.651 m)   Wt 157 lb (71.2 kg)   SpO2 97%   BMI 26.13 kg/m?  ?Body mass index is 26.13 kg/m?Marland Kitchen. ?Physical Exam ?Vitals and nursing note reviewed.  ?Constitutional:   ?   General: He is not in acute distress. ?   Appearance: Normal appearance. He is not ill-appearing, toxic-appearing or diaphoretic.  ?HENT:  ?   Head: Normocephalic and atraumatic.  ?   Nose: Mucosal edema, congestion and rhinorrhea present. Rhinorrhea is purulent.  ?   Right Turbinates: Enlarged.  ?   Left Turbinates: Enlarged.  ?   Right Sinus: Maxillary sinus tenderness present.  ?   Left Sinus: Maxillary sinus tenderness present.  ?   Mouth/Throat:  ?   Mouth: Mucous membranes are moist.  ?   Pharynx: No oropharyngeal exudate or posterior oropharyngeal erythema.  ?Eyes:  ?   General: No scleral icterus.    ?   Right eye: No discharge.     ?   Left eye: No discharge.  ?   Extraocular Movements: Extraocular movements intact.  ?   Pupils: Pupils are equal, round, and reactive to light.  ?Cardiovascular:  ?   Rate and Rhythm: Normal rate and regular rhythm.  ?Pulmonary:  ?   Effort: Pulmonary effort is normal. No respiratory distress.  ?   Breath sounds: Rhonchi present. No wheezing or rales.  ?Abdominal:  ?   General: Abdomen is flat.  ?   Palpations: Abdomen is soft.  ?Musculoskeletal:  ?   Cervical back: Neck supple.  ?Lymphadenopathy:  ?   Cervical: No cervical adenopathy.  ?Skin: ?   General: Skin is warm and dry.  ?   Coloration: Skin is not jaundiced or pale.  ?   Findings: No rash.  ?Neurological:  ?   Mental Status: He is alert and oriented to person, place, and  time. Mental status is at baseline.  ?Psychiatric:     ?   Mood and Affect: Mood normal.     ?   Behavior: Behavior normal.     ?   Thought  Content: Thought content normal.     ?   Judgment: Judgment normal.  ? ? ? ?No results found. ?No results found. ?No results found for this or any previous visit (from the past 24 hour(s)). ? ?Assessment/Plan: ?Christian Watson is a 61 y.o. male present for OV for  ?Acute cough/Acute non-recurrent maxillary sinusitis ?Likely viral illness, he is starting to feel better today.  Did provide him antibiotic prescription to start if symptoms start to rebound. ?Rest, hydrate.  ?Start flonase ?Continue mucinex (DM if cough), ?Doxycycline prescribed, start only if symptoms do not continue to improve or worsen over the next 3-5 days ?Hycodan cough syrup prescribed ?Follow-up 2 weeks if not seeing improvement, sooner if worsening ? ? ?Reviewed expectations re: course of current medical issues. ?Discussed self-management of symptoms. ?Outlined signs and symptoms indicating need for more acute intervention. ?Patient verbalized understanding and all questions were answered. ?Patient received an After-Visit Summary. ? ? ? ?No orders of the defined types were placed in this encounter. ? ?Meds ordered this encounter  ?Medications  ? doxycycline (VIBRA-TABS) 100 MG tablet  ?  Sig: Take 1 tablet (100 mg total) by mouth 2 (two) times daily.  ?  Dispense:  20 tablet  ?  Refill:  0  ? HYDROcodone bit-homatropine (HYCODAN) 5-1.5 MG/5ML syrup  ?  Sig: Take 5 mLs by mouth every 8 (eight) hours as needed for cough.  ?  Dispense:  120 mL  ?  Refill:  0  ? ?Referral Orders  ?No referral(s) requested today  ? ? ? ?Note is dictated utilizing voice recognition software. Although note has been proof read prior to signing, occasional typographical errors still can be missed. If any questions arise, please do not hesitate to call for verification.  ? ?electronically signed by: ? ?Felix Pacini, DO  ?[facility] [place] ? ? ? ?

## 2021-12-19 ENCOUNTER — Other Ambulatory Visit: Payer: Self-pay

## 2021-12-19 MED ORDER — ATORVASTATIN CALCIUM 20 MG PO TABS: 20.0000 mg | ORAL_TABLET | Freq: Every evening | ORAL | 0 refills | Status: DC

## 2022-04-17 ENCOUNTER — Encounter: Payer: Self-pay | Admitting: Family Medicine

## 2022-04-17 ENCOUNTER — Ambulatory Visit (INDEPENDENT_AMBULATORY_CARE_PROVIDER_SITE_OTHER): Payer: BC Managed Care – PPO | Admitting: Family Medicine

## 2022-04-17 ENCOUNTER — Other Ambulatory Visit (INDEPENDENT_AMBULATORY_CARE_PROVIDER_SITE_OTHER): Payer: BC Managed Care – PPO

## 2022-04-17 VITALS — BP 130/72 | HR 56 | Temp 97.6°F | Ht 65.95 in | Wt 159.0 lb

## 2022-04-17 DIAGNOSIS — Z0000 Encounter for general adult medical examination without abnormal findings: Secondary | ICD-10-CM | POA: Diagnosis not present

## 2022-04-17 DIAGNOSIS — Z79899 Other long term (current) drug therapy: Secondary | ICD-10-CM

## 2022-04-17 DIAGNOSIS — Z8249 Family history of ischemic heart disease and other diseases of the circulatory system: Secondary | ICD-10-CM

## 2022-04-17 DIAGNOSIS — M47819 Spondylosis without myelopathy or radiculopathy, site unspecified: Secondary | ICD-10-CM

## 2022-04-17 DIAGNOSIS — E785 Hyperlipidemia, unspecified: Secondary | ICD-10-CM

## 2022-04-17 DIAGNOSIS — I77811 Abdominal aortic ectasia: Secondary | ICD-10-CM

## 2022-04-17 DIAGNOSIS — Z125 Encounter for screening for malignant neoplasm of prostate: Secondary | ICD-10-CM

## 2022-04-17 DIAGNOSIS — Z23 Encounter for immunization: Secondary | ICD-10-CM

## 2022-04-17 LAB — COMPREHENSIVE METABOLIC PANEL
ALT: 23 U/L (ref 0–53)
AST: 22 U/L (ref 0–37)
Albumin: 4.2 g/dL (ref 3.5–5.2)
Alkaline Phosphatase: 56 U/L (ref 39–117)
BUN: 19 mg/dL (ref 6–23)
CO2: 30 mEq/L (ref 19–32)
Calcium: 9.8 mg/dL (ref 8.4–10.5)
Chloride: 103 mEq/L (ref 96–112)
Creatinine, Ser: 0.94 mg/dL (ref 0.40–1.50)
GFR: 88.94 mL/min (ref >60.00)
Glucose, Bld: 99 mg/dL (ref 70–99)
Potassium: 4.9 mEq/L (ref 3.5–5.1)
Sodium: 140 mEq/L (ref 135–145)
Total Bilirubin: 0.4 mg/dL (ref 0.2–1.2)
Total Protein: 7.2 g/dL (ref 6.0–8.3)

## 2022-04-17 LAB — CBC
HCT: 37.2 % — ABNORMAL LOW (ref 39.0–52.0)
Hemoglobin: 12.5 g/dL — ABNORMAL LOW (ref 13.0–17.0)
MCHC: 33.7 g/dL (ref 30.0–36.0)
MCV: 95.6 fl (ref 78.0–100.0)
Platelets: 199 10*3/uL (ref 150.0–400.0)
RBC: 3.89 Mil/uL — ABNORMAL LOW (ref 4.22–5.81)
RDW: 13 % (ref 11.5–15.5)
WBC: 3.4 10*3/uL — ABNORMAL LOW (ref 4.0–10.5)

## 2022-04-17 LAB — LIPID PANEL
Cholesterol: 182 mg/dL (ref 0–200)
HDL: 73.4 mg/dL (ref >39.00)
LDL Cholesterol: 89 mg/dL (ref 0–99)
NonHDL: 108.33
Total CHOL/HDL Ratio: 2
Triglycerides: 96 mg/dL (ref 0.0–149.0)
VLDL: 19.2 mg/dL (ref 0.0–40.0)

## 2022-04-17 MED ORDER — ATORVASTATIN CALCIUM 20 MG PO TABS: 20.0000 mg | ORAL_TABLET | Freq: Every evening | ORAL | 3 refills | Status: DC

## 2022-04-17 MED ORDER — PREDNISONE 20 MG PO TABS: ORAL_TABLET | ORAL | 0 refills | Status: DC

## 2022-04-17 MED ORDER — GABAPENTIN 100 MG PO CAPS: 100.0000 mg | ORAL_CAPSULE | Freq: Every day | ORAL | 1 refills | Status: DC

## 2022-04-17 NOTE — Patient Instructions (Signed)
Return in about 1 year (around 04/19/2023) for cpe (20 min), Routine chronic condition follow-up.        Great to see you today.  I have refilled the medication(s) we provide.   If labs were collected, we will inform you of lab results once received either by echart message or telephone call.   - echart message- for normal results that have been seen by the patient already.   - telephone call: abnormal results or if patient has not viewed results in their echart.'   Health Maintenance, Male Adopting a healthy lifestyle and getting preventive care are important in promoting health and wellness. Ask your health care provider about: The right schedule for you to have regular tests and exams. Things you can do on your own to prevent diseases and keep yourself healthy. What should I know about diet, weight, and exercise? Eat a healthy diet  Eat a diet that includes plenty of vegetables, fruits, low-fat dairy products, and lean protein. Do not eat a lot of foods that are high in solid fats, added sugars, or sodium. Maintain a healthy weight Body mass index (BMI) is a measurement that can be used to identify possible weight problems. It estimates body fat based on height and weight. Your health care provider can help determine your BMI and help you achieve or maintain a healthy weight. Get regular exercise Get regular exercise. This is one of the most important things you can do for your health. Most adults should: Exercise for at least 150 minutes each week. The exercise should increase your heart rate and make you sweat (moderate-intensity exercise). Do strengthening exercises at least twice a week. This is in addition to the moderate-intensity exercise. Spend less time sitting. Even light physical activity can be beneficial. Watch cholesterol and blood lipids Have your blood tested for lipids and cholesterol at 59 years of age, then have this test every 5 years. You may need to have your  cholesterol levels checked more often if: Your lipid or cholesterol levels are high. You are older than 59 years of age. You are at high risk for heart disease. What should I know about cancer screening? Many types of cancers can be detected early and may often be prevented. Depending on your health history and family history, you may need to have cancer screening at various ages. This may include screening for: Colorectal cancer. Prostate cancer. Skin cancer. Lung cancer. What should I know about heart disease, diabetes, and high blood pressure? Blood pressure and heart disease High blood pressure causes heart disease and increases the risk of stroke. This is more likely to develop in people who have high blood pressure readings or are overweight. Talk with your health care provider about your target blood pressure readings. Have your blood pressure checked: Every 3-5 years if you are 92-31 years of age. Every year if you are 69 years old or older. If you are between the ages of 44 and 11 and are a current or former smoker, ask your health care provider if you should have a one-time screening for abdominal aortic aneurysm (AAA). Diabetes Have regular diabetes screenings. This checks your fasting blood sugar level. Have the screening done: Once every three years after age 73 if you are at a normal weight and have a low risk for diabetes. More often and at a younger age if you are overweight or have a high risk for diabetes. What should I know about preventing infection? Hepatitis B If you  have a higher risk for hepatitis B, you should be screened for this virus. Talk with your health care provider to find out if you are at risk for hepatitis B infection. Hepatitis C Blood testing is recommended for: Everyone born from [birth year] through 1963. Anyone with known risk factors for hepatitis C. Sexually transmitted infections (STIs) You should be screened each year for STIs, including gonorrhea  and chlamydia, if: You are sexually active and are younger than 59 years of age. You are older than 59 years of age and your health care provider tells you that you are at risk for this type of infection. Your sexual activity has changed since you were last screened, and you are at increased risk for chlamydia or gonorrhea. Ask your health care provider if you are at risk. Ask your health care provider about whether you are at high risk for HIV. Your health care provider may recommend a prescription medicine to help prevent HIV infection. If you choose to take medicine to prevent HIV, you should first get tested for HIV. You should then be tested every 3 months for as long as you are taking the medicine. Follow these instructions at home: Alcohol use Do not drink alcohol if your health care provider tells you not to drink. If you drink alcohol: Limit how much you have to 0-2 drinks a day. Know how much alcohol is in your drink. In the [place] one drink equals one 12 oz bottle of beer (355 mL), one 5 oz glass of wine (148 mL), or one 1 oz glass of hard liquor (44 mL). Lifestyle Do not use any products that contain nicotine or tobacco. These products include cigarettes, chewing tobacco, and vaping devices, such as e-cigarettes. If you need help quitting, ask your health care provider. Do not use street drugs. Do not share needles. Ask your health care provider for help if you need support or information about quitting drugs. General instructions Schedule regular health, dental, and eye exams. Stay current with your vaccines. Tell your health care provider if: You often feel depressed. You have ever been abused or do not feel safe at home. Summary Adopting a healthy lifestyle and getting preventive care are important in promoting health and wellness. Follow your health care provider's instructions about healthy diet, exercising, and getting tested or screened for diseases. Follow your health  care provider's instructions on monitoring your cholesterol and blood pressure. This information is not intended to replace advice given to you by your health care provider. Make sure you discuss any questions you have with your health care provider. Document Revised: 12/03/2020 Document Reviewed: 12/03/2020 Elsevier Patient Education  [place].

## 2022-04-17 NOTE — Progress Notes (Signed)
Patient ID: Christian Watson, male  DOB: 18-Nov-1960, 61 y.o.   MRN: [id number] Patient Care Team    Relationship Specialty Notifications Start End  Natalia Leatherwood, DO PCP - General Family Medicine  10/11/18   Roslynn Amble, MD (Inactive) Consulting Physician Pulmonary Disease  06/24/17     Chief Complaint  Patient presents with   Annual Exam    Pt is fasting    Subjective:  Christian Watson is a 61 y.o. male present for CPE/CMC All past medical history, surgical history, allergies, family history, immunizations, medications and social history were updated in the electronic medical record today. All recent labs, ED visits and hospitalizations within the last year were reviewed.  Health maintenance:  Colonoscopy: Dr. Kinnie Scales 2015/2016- normal - 10 yr Immunizations:  tdap UTD 2016, influenza declined(encourage yearly), covid x3, shingrix completed Infectious disease screening: HIV and Hep C completed PSA:  Lab Results  Component Value Date   PSA 0.50 12/14/2020   PSA 0.79 06/24/2017  , pt was counseled on prostate cancer screenings.  Assistive device: none Oxygen WUJ:[id number] Patient has a Dental home. Hospitalizations/ED visits: reviewed   Hyperlipidemia: Patient reports compliance with atorvastatin 20 mg nightly.  He is fasting today.  Back pain: Patient reports he has had more frequent left lower back pain that radiates down his left leg over the last 3-4 weeks.  He does not recover a certain event/injury or overactivity around that time.  He has had known degenerative joint disease of the spinal fossa of his lumbar spine since at least 2019.  Reviewed 2019 x-ray with him that noted anterior degenerative spurring from L2-L3 and L4-L5.  He states yesterday morning, he was barely able to get out of bed or walk.  2019 lumbar x-ray: FINDINGS: Punctate calcifications project over both kidneys compatible with renal stones. Normal alignment within the lumbar spine.  Anterior degenerative spurring from L2-3 to L4-5. Degenerative facet disease throughout the lumbar spine. No fracture. SI joints are symmetric and unremarkable.     04/17/2022    9:11 AM 11/12/2021    9:55 AM 12/14/2020    9:46 AM 10/26/2020   11:29 AM 02/05/2018    7:12 PM  Depression screen PHQ 2/9  Decreased Interest 0 0 0 0 0  Down, Depressed, Hopeless 0 0 0 0 0  PHQ - 2 Score 0 0 0 0 0      04/17/2022    9:11 AM  GAD 7 : Generalized Anxiety Score  Nervous, Anxious, on Edge 0  Control/stop worrying 0  Worry too much - different things 0  Trouble relaxing 0  Restless 0  Easily annoyed or irritable 0  Afraid - awful might happen 0  Total GAD 7 Score 0          06/24/2017    8:23 AM  Fall Risk   Falls in the past year? No    Immunization History  Administered Date(s) Administered   PFIZER(Purple Top)SARS-COV-2 Vaccination 10/06/2019, 10/31/2019, 05/18/2020   Tdap 04/03/2015   Zoster Recombinat (Shingrix) 12/14/2020, 03/06/2021   Past Medical History:  Diagnosis Date   Acute bursitis of left shoulder 11/16/2017   Chicken pox    Hip impingement syndrome, right 08/18/2017   Sarcoidosis    No Active Allergies  Past Surgical History:  Procedure Laterality Date   BRONCHOSCOPY     Bronchial Wash & Transbronchial Biopsies RML & RUL   CARPAL TUNNEL RELEASE Left    COLONOSCOPY  cyst removed  as a child   from chin   Family History  Problem Relation Age of Onset   Heart disease Maternal Grandmother    Heart disease Maternal Grandfather    Cancer Maternal Grandfather        unsure what kind   Heart disease Paternal Grandmother    Cancer Paternal Grandmother        unsure what kind   Heart disease Paternal Grandfather    Heart disease Mother    Hypertension Father    Diabetes Father    Hearing loss Father    Diabetes Sister    Lung disease Neg Hx    Sarcoidosis Neg Hx    Rheumatologic disease Neg Hx    Social History   Social History Narrative    Originally from [place]. Has lived in [place], [place], & moved to [place] in 2005. 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 Has prior international travel to [place], [place], [place], [place], [place], [place], & [place]. Has a dog currently. No bird, mold, or hot tub exposure. Enjoys playing golf.    Married. College educated. Business owner.   Exercises routinely.   Takes a multivitamin, drinks caffeine, uses herbal remedies.   Occasional alcohol.   Wears a bicycle helmet. Has a smoke alarm in the home. Wears his seatbelt.   Safe in his relationships.    Allergies as of 04/17/2022   No Active Allergies      Medication List        Accurate as of April 17, 2022  1:31 PM. If you have any questions, ask your nurse or doctor.          STOP taking these medications    doxycycline 100 MG tablet Commonly known as: VIBRA-TABS Stopped by: Felix Pacinienee Soila Printup, DO   HYDROcodone bit-homatropine 5-1.5 MG/5ML syrup Commonly known as: HYCODAN Stopped by: Felix Pacinienee Gianpaolo Mindel, DO   hydrOXYzine 10 MG tablet Commonly known as: ATARAX Stopped by: Felix Pacinienee Tedd Cottrill, DO       TAKE these medications    atorvastatin 20 MG tablet Commonly known as: LIPITOR Take 1 tablet (20 mg total) by mouth every evening.   gabapentin 100 MG capsule Commonly known as: NEURONTIN Take 1-2 capsules (100-200 mg total) by mouth at bedtime. Started by: Felix Pacinienee Jaiyanna Safran, DO   multivitamin tablet Take 1 tablet by mouth daily.   predniSONE 20 MG tablet Commonly known as: DELTASONE 60 mg x3d, 40 mg x3d, 20 mg x2d, 10 mg x2d Started by: Felix Pacinienee Curvin Hunger, DO       All past medical history, surgical history, allergies, family history, immunizations andmedications were updated in the EMR today and reviewed under the history and medication portions of their EMR.     No results found for this or any previous visit (from the past 2160  hour(s)).   ROS 14 pt review of systems performed and negative (unless mentioned in an HPI)  Objective: BP 130/72   Pulse (!) 56   Temp 97.6 F (36.4 C)   Ht 5' 5.95" (1.675 m)   Wt 159 lb (72.1 kg)   BMI 25.71 kg/m  Physical Exam Constitutional:      General: He is not in acute distress.    Appearance: Normal appearance. He is not ill-appearing, toxic-appearing or diaphoretic.  HENT:     Head: Normocephalic and atraumatic.     Right Ear: Tympanic membrane, ear canal and external ear  normal. There is no impacted cerumen.     Left Ear: Tympanic membrane, ear canal and external ear normal. There is no impacted cerumen.     Nose: Nose normal. No congestion or rhinorrhea.     Mouth/Throat:     Mouth: Mucous membranes are moist.     Pharynx: Oropharynx is clear. No oropharyngeal exudate or posterior oropharyngeal erythema.  Eyes:     General: No scleral icterus.       Right eye: No discharge.        Left eye: No discharge.     Extraocular Movements: Extraocular movements intact.     Pupils: Pupils are equal, round, and reactive to light.  Cardiovascular:     Rate and Rhythm: Normal rate and regular rhythm.     Pulses: Normal pulses.     Heart sounds: Normal heart sounds. No murmur heard.    No friction rub. No gallop.  Pulmonary:     Effort: Pulmonary effort is normal. No respiratory distress.     Breath sounds: Normal breath sounds. No stridor. No wheezing, rhonchi or rales.  Chest:     Chest wall: No tenderness.  Abdominal:     General: Abdomen is flat. Bowel sounds are normal. There is no distension.     Palpations: Abdomen is soft. There is no mass.     Tenderness: There is no abdominal tenderness. There is no right CVA tenderness, left CVA tenderness, guarding or rebound.     Hernia: No hernia is present.  Musculoskeletal:        General: No swelling.     Cervical back: Normal range of motion and neck supple.     Lumbar back: Spasms and tenderness present. No bony  tenderness. Decreased range of motion.     Right lower leg: No edema.     Left lower leg: No edema.  Lymphadenopathy:     Cervical: No cervical adenopathy.  Skin:    General: Skin is warm and dry.     Coloration: Skin is not jaundiced.     Findings: No bruising, lesion or rash.  Neurological:     General: No focal deficit present.     Mental Status: He is alert and oriented to person, place, and time. Mental status is at baseline.     Cranial Nerves: No cranial nerve deficit.     Sensory: No sensory deficit.     Motor: No weakness.     Coordination: Coordination normal.     Gait: Gait normal.     Deep Tendon Reflexes: Reflexes normal.  Psychiatric:        Mood and Affect: Mood normal.        Behavior: Behavior normal.        Thought Content: Thought content normal.        Judgment: Judgment normal.     No results found.  Assessment/plan: Christian Watson is a 61 y.o. male present for CPE/CMC Hyperlipidemia goal < 100/FH heart disease Stable Continue lipitor 20 mg qhs Discussed cardiac CT eval. If desired. He will think about it. If he decides to have completed he is aware it is $99 out of pocket cost. I would be happy to order this for him without needing 2nd appt since we discussed in detail today.  CBC, CMP, TSH and lipids collected today  Ectatic abdominal aorta (HCC) Rpt [place] 2024  immunization against influenza-declined Prostate cancer screening - PSA Encounter for long-term current use of medication - Hemoglobin A1c  Degenerative joint disease of spinal facet joint with spurring: The dermatome affected seems to be the L4-5 dermatome.  We discussed options for treatment and evaluation today. He would like to try the prednisone taper and short-term gabapentin to see if he is able to calm down and irritated nerve. Gabapentin 100-200 mg twice daily as needed Prednisone 10-day taper He will follow-up in 2-4 weeks if symptoms are not resolved, sooner if worsening.   We did move forward with x-ray and referral back to Dr. Herma Mering who has seen him in the past for prior condition.   Routine general medical examination at a health care facility Colonoscopy: Dr. Earlean Shawl 2015/2016- normal - 10 yr Immunizations:  tdap UTD 2016, influenza declined(encourage yearly), covid x3, shingrix completed Infectious disease screening: HIV and Hep C completed Patient was encouraged to exercise greater than 150 minutes a week. Patient was encouraged to choose a diet filled with fresh fruits and vegetables, and lean meats. AVS provided to patient today for education/recommendation on gender specific health and safety maintenance.  Return in about 1 year (around 04/19/2023) for cpe (20 min), Routine chronic condition follow-up.   Orders Placed This Encounter  Procedures   CBC   Comprehensive metabolic panel   Hemoglobin A1c   Lipid panel   TSH   PSA   Meds ordered this encounter  Medications   atorvastatin (LIPITOR) 20 MG tablet    Sig: Take 1 tablet (20 mg total) by mouth every evening.    Dispense:  90 tablet    Refill:  3   predniSONE (DELTASONE) 20 MG tablet    Sig: 60 mg x3d, 40 mg x3d, 20 mg x2d, 10 mg x2d    Dispense:  18 tablet    Refill:  0   gabapentin (NEURONTIN) 100 MG capsule    Sig: Take 1-2 capsules (100-200 mg total) by mouth at bedtime.    Dispense:  60 capsule    Refill:  1   Referral Orders  No referral(s) requested today     Note is dictated utilizing voice recognition software. Although note has been proof read prior to signing, occasional typographical errors still can be missed. If any questions arise, please do not hesitate to call for verification.  Electronically signed by: Christian Pouch, DO [place]

## 2022-04-18 LAB — PSA: PSA: 0.63 ng/mL (ref 0.10–4.00)

## 2022-04-18 LAB — TSH: TSH: 2.68 u[IU]/mL (ref 0.35–5.50)

## 2022-04-21 ENCOUNTER — Telehealth: Payer: Self-pay | Admitting: Family Medicine

## 2022-04-21 LAB — HEMOGLOBIN A1C: Hgb A1c MFr Bld: 5.6 % (ref 4.6–6.5)

## 2022-04-21 NOTE — Telephone Encounter (Signed)
Spoke with patient regarding results/recommendations.  

## 2022-04-21 NOTE — Telephone Encounter (Signed)
Please call patient Liver, kidney and thyroid function are normal electrolytes are normal Diabetes screening/A1c is normal  Cholesterol panel looks great and is at goal for him. Prostate cancer screening is normal.  Blood cell counts are in the anemic range, with lower than normal WBCs, RBCs, hemoglobin, hematocrit and even though in the normal range his platelets are also slowly dropping.  -He did not report any noted bleeding during his exam.  Has he noticed any blood in his stool? I would like to see him back in about 4 weeks so that we can retest a full panel/blood cell count and send for pathology to review along with an iron count.

## 2022-05-01 ENCOUNTER — Encounter: Payer: Self-pay | Admitting: Family Medicine

## 2022-05-01 ENCOUNTER — Telehealth (INDEPENDENT_AMBULATORY_CARE_PROVIDER_SITE_OTHER): Payer: BC Managed Care – PPO | Admitting: Family Medicine

## 2022-05-01 DIAGNOSIS — M47819 Spondylosis without myelopathy or radiculopathy, site unspecified: Secondary | ICD-10-CM | POA: Diagnosis not present

## 2022-05-01 DIAGNOSIS — Z8781 Personal history of (healed) traumatic fracture: Secondary | ICD-10-CM | POA: Diagnosis not present

## 2022-05-01 MED ORDER — METHOCARBAMOL 500 MG PO TABS: 500.0000 mg | ORAL_TABLET | Freq: Three times a day (TID) | ORAL | 1 refills | Status: DC | PRN

## 2022-05-01 NOTE — Progress Notes (Signed)
VIRTUAL VISIT VIA VIDEO  I connected with Christian Watson on 05/01/22 at 10:40 AM EDT by a video enabled telemedicine application and verified that I am speaking with the correct person using two identifiers. Location patient: Home Location provider: [facility], Office Persons participating in the virtual visit: Patient, Dr. Raoul Pitch and V.Smith, [place] discussed the limitations of evaluation and management by telemedicine and the availability of in person appointments. The patient expressed understanding and agreed to proceed.     Patient ID: Christian Watson, male  DOB: 1960/11/13, 61 y.o.   MRN: [id number] Patient Care Team    Relationship Specialty Notifications Start End  [name], DO PCP - General Family Medicine  10/11/18   [name], MD (Inactive) Consulting Physician Pulmonary Disease  06/24/17     Chief Complaint  Patient presents with   Back Pain    Subjective:  Christian Watson is a 61 y.o. male present for  Back pain: pt reports he has finished the prednisone taper and is still taking gabapentin. The radiation of pain down his leg has resolved. He feels better except he continues to occasionally experience and sharp grabbing pain in his lower back when bending and transitioning positions. He is concerned he is still irritating his lower back and wants to be able to get back to his normal activities.   Prior note: Patient reports he has had more frequent left lower back pain that radiates down his left leg over the last 3-4 weeks.  He does not recall a certain event/injury or overactivity around that time.  He has had known degenerative joint disease of the spinal facet of his lumbar spine since at least 2019.  Reviewed 2019 x-ray with him that noted anterior degenerative spurring from L2-L3 and L4-L5.  He states yesterday morning, he was barely able to get out of bed or walk.  2019 lumbar x-ray: FINDINGS: Punctate calcifications project  over both kidneys compatible with renal stones. Normal alignment within the lumbar spine. Anterior degenerative spurring from L2-3 to L4-5. Degenerative facet disease throughout the lumbar spine. No fracture. SI joints are symmetric and unremarkable.     04/17/2022    9:11 AM 11/12/2021    9:55 AM 12/14/2020    9:46 AM 10/26/2020   11:29 AM 02/05/2018    7:12 PM  Depression screen PHQ 2/9  Decreased Interest 0 0 0 0 0  Down, Depressed, Hopeless 0 0 0 0 0  PHQ - 2 Score 0 0 0 0 0      04/17/2022    9:11 AM  GAD 7 : Generalized Anxiety Score  Nervous, Anxious, on Edge 0  Control/stop worrying 0  Worry too much - different things 0  Trouble relaxing 0  Restless 0  Easily annoyed or irritable 0  Afraid - awful might happen 0  Total GAD 7 Score 0          06/24/2017    8:23 AM  Fall Risk   Falls in the past year? No    Immunization History  Administered Date(s) Administered   PFIZER(Purple Top)SARS-COV-2 Vaccination 10/06/2019, 10/31/2019, 05/18/2020   Tdap 04/03/2015   Zoster Recombinat (Shingrix) 12/14/2020, 03/06/2021   Past Medical History:  Diagnosis Date   Acute bursitis of left shoulder 11/16/2017   Chicken pox    Hip impingement syndrome, right 08/18/2017   Sarcoidosis    No Known Allergies  Past Surgical History:  Procedure Laterality Date  BRONCHOSCOPY     Bronchial Wash & Transbronchial Biopsies RML & RUL   CARPAL TUNNEL RELEASE Left    COLONOSCOPY     cyst removed  as a child   from chin   Family History  Problem Relation Age of Onset   Heart disease Maternal Grandmother    Heart disease Maternal Grandfather    Cancer Maternal Grandfather        unsure what kind   Heart disease Paternal Grandmother    Cancer Paternal Grandmother        unsure what kind   Heart disease Paternal Grandfather    Heart disease Mother    Hypertension Father    Diabetes Father    Hearing loss Father    Diabetes Sister    Lung disease Neg Hx    Sarcoidosis Neg Hx     Rheumatologic disease Neg Hx    Social History   Social History Narrative   Originally from [place]. Has lived in [place], [place], & moved to [place] in 2005. 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 Has prior international travel to [place], [place], [place], [place], [place], [place], & [place]. Has a dog currently. No bird, mold, or hot tub exposure. Enjoys playing golf.    Married. College educated. Business owner.   Exercises routinely.   Takes a multivitamin, drinks caffeine, uses herbal remedies.   Occasional alcohol.   Wears a bicycle helmet. Has a smoke alarm in the home. Wears his seatbelt.   Safe in his relationships.    Allergies as of 05/01/2022   No Known Allergies      Medication List        Accurate as of May 01, 2022 11:10 AM. If you have any questions, ask your nurse or doctor.          STOP taking these medications    predniSONE 20 MG tablet Commonly known as: DELTASONE Stopped by: Felix Pacini, DO       TAKE these medications    atorvastatin 20 MG tablet Commonly known as: LIPITOR Take 1 tablet (20 mg total) by mouth every evening.   gabapentin 100 MG capsule Commonly known as: NEURONTIN Take 1-2 capsules (100-200 mg total) by mouth at bedtime.   methocarbamol 500 MG tablet Commonly known as: ROBAXIN Take 1 tablet (500 mg total) by mouth every 8 (eight) hours as needed for muscle spasms. Started by: Felix Pacini, DO   multivitamin tablet Take 1 tablet by mouth daily.       All past medical history, surgical history, allergies, family history, immunizations andmedications were updated in the EMR today and reviewed under the history and medication portions of their EMR.       ROS 14 pt review of systems performed and negative (unless mentioned in an HPI)  Objective: There were no vitals taken for this visit. Physical Exam Vitals and nursing note  reviewed.  Constitutional:      General: He is not in acute distress.    Appearance: Normal appearance. He is not ill-appearing, toxic-appearing or diaphoretic.  HENT:     Head: Normocephalic and atraumatic.  Eyes:     General: No scleral icterus.       Right eye: No discharge.        Left eye: No discharge.     Extraocular Movements: Extraocular movements intact.     Pupils: Pupils are equal, round, and reactive to  light.  Neurological:     Mental Status: He is alert and oriented to person, place, and time. Mental status is at baseline.  Psychiatric:        Mood and Affect: Mood normal.        Behavior: Behavior normal.        Thought Content: Thought content normal.        Judgment: Judgment normal.     No results found.  Assessment/plan: Christian Watson is a 61 y.o. male present for  Degenerative joint disease of spinal facet joint with spurring: He is seeing some improvement, but is not able to return to normal activities yet.  dermatome affected seems to be the L4-5 dermatome.   Continue Gabapentin 100-200 mg twice daily as needed Robaxin prescribed if needed.  Continue heat nad light stretches.  Referred back to Dr. Ethelene Hal    Return if symptoms worsen or fail to improve.   Orders Placed This Encounter  Procedures   Ambulatory referral to Orthopedic Surgery   Meds ordered this encounter  Medications   methocarbamol (ROBAXIN) 500 MG tablet    Sig: Take 1 tablet (500 mg total) by mouth every 8 (eight) hours as needed for muscle spasms.    Dispense:  30 tablet    Refill:  1   Referral Orders         Ambulatory referral to Orthopedic Surgery       Note is dictated utilizing voice recognition software. Although note has been proof read prior to signing, occasional typographical errors still can be missed. If any questions arise, please do not hesitate to call for verification.  Electronically signed by: Felix Pacini, DO [facility] [place]

## 2022-05-07 DIAGNOSIS — M545 Low back pain, unspecified: Secondary | ICD-10-CM | POA: Diagnosis not present

## 2022-05-19 ENCOUNTER — Ambulatory Visit: Payer: BC Managed Care – PPO | Admitting: Family Medicine

## 2022-05-19 ENCOUNTER — Encounter: Payer: Self-pay | Admitting: Family Medicine

## 2022-05-19 VITALS — BP 139/83 | HR 72 | Temp 98.2°F | Wt 156.8 lb

## 2022-05-19 DIAGNOSIS — Z8719 Personal history of other diseases of the digestive system: Secondary | ICD-10-CM

## 2022-05-19 DIAGNOSIS — D649 Anemia, unspecified: Secondary | ICD-10-CM | POA: Diagnosis not present

## 2022-05-19 DIAGNOSIS — D72819 Decreased white blood cell count, unspecified: Secondary | ICD-10-CM | POA: Diagnosis not present

## 2022-05-19 NOTE — Progress Notes (Signed)
Christian Watson , 14-Jan-1961, 61 y.o., male MRN: [id number] Patient Care Team    Relationship Specialty Notifications Start End  Natalia Leatherwood, DO PCP - General Family Medicine  10/11/18   Roslynn Amble, MD (Inactive) Consulting Physician Pulmonary Disease  06/24/17     Chief Complaint  Patient presents with   Anemia    Pt is fasting     Subjective: Pt presents for an OV follow-up on anemia found during his routine physical.  Patient has a history of rectal bleeding this time last year with colonoscopy that was unrevealing by Dr. Jennye Boroughs team.  He denies any notable rectal bleeding over the last year.  He states there is been times where he saw all stools that were a little darker and wondered if he had rectal bleeding again.  He is feeling well, no abdominal pain, no unintentional weight loss.  He has been struggling with some back pain and is seeing Dr. Horald Chestnut again and performing physical therapy. He does not use NSAIDs routinely. Hemoglobin 12.5, hematocrit 37.2, RBCs 3.89, WBCs 3.4, platelets 199     04/17/2022    9:11 AM 11/12/2021    9:55 AM 12/14/2020    9:46 AM 10/26/2020   11:29 AM 02/05/2018    7:12 PM  Depression screen PHQ 2/9  Decreased Interest 0 0 0 0 0  Down, Depressed, Hopeless 0 0 0 0 0  PHQ - 2 Score 0 0 0 0 0    No Known Allergies Social History   Social History Narrative   Originally from [place]. Has lived in [place], [place], & moved to [place] in 2005. 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 Has prior international travel to [place], [place], [place], [place], [place], [place], & [place]. Has a dog currently. No bird, mold, or hot tub exposure. Enjoys playing golf.    Married. College educated. Business owner.   Exercises routinely.   Takes a multivitamin, drinks caffeine, uses herbal remedies.   Occasional alcohol.   Wears a bicycle helmet. Has a smoke alarm in  the home. Wears his seatbelt.   Safe in his relationships.   Past Medical History:  Diagnosis Date   Acute bursitis of left shoulder 11/16/2017   Chicken pox    Hip impingement syndrome, right 08/18/2017   Sarcoidosis    Past Surgical History:  Procedure Laterality Date   BRONCHOSCOPY     Bronchial Wash & Transbronchial Biopsies RML & RUL   CARPAL TUNNEL RELEASE Left    COLONOSCOPY     cyst removed  as a child   from chin   Family History  Problem Relation Age of Onset   Heart disease Maternal Grandmother    Heart disease Maternal Grandfather    Cancer Maternal Grandfather        unsure what kind   Heart disease Paternal Grandmother    Cancer Paternal Grandmother        unsure what kind   Heart disease Paternal Grandfather    Heart disease Mother    Hypertension Father    Diabetes Father    Hearing loss Father    Diabetes Sister    Lung disease Neg Hx    Sarcoidosis Neg Hx    Rheumatologic disease Neg Hx    Allergies as of 05/19/2022   No Known Allergies      Medication List  Accurate as of May 19, 2022  9:00 AM. If you have any questions, ask your nurse or doctor.          atorvastatin 20 MG tablet Commonly known as: LIPITOR Take 1 tablet (20 mg total) by mouth every evening.   gabapentin 100 MG capsule Commonly known as: NEURONTIN Take 1-2 capsules (100-200 mg total) by mouth at bedtime.   methocarbamol 500 MG tablet Commonly known as: ROBAXIN Take 1 tablet (500 mg total) by mouth every 8 (eight) hours as needed for muscle spasms.   multivitamin tablet Take 1 tablet by mouth daily.        All past medical history, surgical history, allergies, family history, immunizations andmedications were updated in the EMR today and reviewed under the history and medication portions of their EMR.     ROS Negative, with the exception of above mentioned in HPI   Objective:  BP 139/83   Pulse 72   Temp 98.2 F (36.8 C)   Wt 156 lb 12.8 oz  (71.1 kg)   SpO2 97%   BMI 25.35 kg/m  Body mass index is 25.35 kg/m. Physical Exam Vitals and nursing note reviewed.  Constitutional:      General: He is not in acute distress.    Appearance: Normal appearance. He is not ill-appearing, toxic-appearing or diaphoretic.  HENT:     Head: Normocephalic and atraumatic.  Eyes:     General: No scleral icterus.       Right eye: No discharge.        Left eye: No discharge.     Extraocular Movements: Extraocular movements intact.     Pupils: Pupils are equal, round, and reactive to light.  Cardiovascular:     Rate and Rhythm: Normal rate and regular rhythm.     Heart sounds: No murmur heard. Pulmonary:     Effort: Pulmonary effort is normal. No respiratory distress.     Breath sounds: Normal breath sounds. No wheezing, rhonchi or rales.  Skin:    General: Skin is warm and dry.     Coloration: Skin is not jaundiced or pale.     Findings: No rash.  Neurological:     Mental Status: He is alert and oriented to person, place, and time. Mental status is at baseline.  Psychiatric:        Mood and Affect: Mood normal.        Behavior: Behavior normal.        Thought Content: Thought content normal.        Judgment: Judgment normal.      No results found. No results found. No results found for this or any previous visit (from the past 24 hour(s)).  Assessment/Plan: Christian Watson is a 61 y.o. male present for OV for  Anemia, unspecified type/History of rectal bleeding Patient has a history of rectal bleeding and was evaluated 1 year ago with colonoscopy that was unremarkable.  Blood cell counts have continued to slowly continue to decline and now anemic with low RBCs, HCT, Hgb and platelets are still in normal range but declining. We discussed possible causes including rectal bleeding that has been undetected by him. We will collect labs today, replete with iron if appropriate and move forward with further eval if not felt related  to rectal bleeding.  If rectal bleeding is present, patient will follow-up with gastroenterology team. - Iron, TIBC and Ferritin Panel - POC Hemoccult Bld/Stl (3-Cd Home Screen); Future - Pathologist smear review -  CBC with Differential/Platelet   Reviewed expectations re: course of current medical issues. Discussed self-management of symptoms. Outlined signs and symptoms indicating need for more acute intervention. Patient verbalized understanding and all questions were answered. Patient received an After-Visit Summary.    Orders Placed This Encounter  Procedures   CBC with Differential/Platelet   Iron, TIBC and Ferritin Panel   POC Hemoccult Bld/Stl (3-Cd Home Screen)   No orders of the defined types were placed in this encounter.  Referral Orders  No referral(s) requested today     Note is dictated utilizing voice recognition software. Although note has been proof read prior to signing, occasional typographical errors still can be missed. If any questions arise, please do not hesitate to call for verification.   electronically signed by:  Felix Pacini, DO  [facility] [place]

## 2022-05-19 NOTE — Patient Instructions (Signed)

## 2022-05-20 LAB — CBC WITH DIFFERENTIAL/PLATELET
Absolute Monocytes: 411 cells/uL (ref 200–950)
Basophils Absolute: 30 cells/uL (ref 0–200)
Basophils Relative: 0.8 %
Eosinophils Absolute: 89 cells/uL (ref 15–500)
Eosinophils Relative: 2.4 %
HCT: 41.1 % (ref 38.5–50.0)
Hemoglobin: 13.5 g/dL (ref 13.2–17.1)
Lymphs Abs: 962 cells/uL (ref 850–3900)
MCH: 31.5 pg (ref 27.0–33.0)
MCHC: 32.8 g/dL (ref 32.0–36.0)
MCV: 95.8 fL (ref 80.0–100.0)
MPV: 9.4 fL (ref 7.5–12.5)
Monocytes Relative: 11.1 %
Neutro Abs: 2209 cells/uL (ref 1500–7800)
Neutrophils Relative %: 59.7 %
Platelets: 262 10*3/uL (ref 140–400)
RBC: 4.29 10*6/uL (ref 4.20–5.80)
RDW: 12.4 % (ref 11.0–15.0)
Total Lymphocyte: 26 %
WBC: 3.7 10*3/uL — ABNORMAL LOW (ref 3.8–10.8)

## 2022-05-20 LAB — IRON,TIBC AND FERRITIN PANEL
%SAT: 23 % (calc) (ref 20–48)
Ferritin: 173 ng/mL (ref 38–380)
Iron: 83 ug/dL (ref 50–180)
TIBC: 363 mcg/dL (calc) (ref 250–425)

## 2022-05-20 LAB — PATHOLOGIST SMEAR REVIEW

## 2022-06-02 DIAGNOSIS — M5416 Radiculopathy, lumbar region: Secondary | ICD-10-CM | POA: Insufficient documentation

## 2022-06-02 LAB — POC HEMOCCULT BLD/STL (HOME/3-CARD/SCREEN)
Card #2 Fecal Occult Blod, POC: NEGATIVE
Card #3 Fecal Occult Blood, POC: NEGATIVE
Fecal Occult Blood, POC: NEGATIVE

## 2022-06-05 ENCOUNTER — Encounter: Payer: Self-pay | Admitting: Family Medicine

## 2022-06-16 DIAGNOSIS — M5416 Radiculopathy, lumbar region: Secondary | ICD-10-CM | POA: Diagnosis not present

## 2022-07-02 DIAGNOSIS — M5416 Radiculopathy, lumbar region: Secondary | ICD-10-CM | POA: Diagnosis not present

## 2022-07-16 DIAGNOSIS — M5416 Radiculopathy, lumbar region: Secondary | ICD-10-CM | POA: Diagnosis not present

## 2022-08-04 DIAGNOSIS — M5451 Vertebrogenic low back pain: Secondary | ICD-10-CM | POA: Diagnosis not present

## 2022-08-11 DIAGNOSIS — M5416 Radiculopathy, lumbar region: Secondary | ICD-10-CM | POA: Diagnosis not present

## 2022-08-19 DIAGNOSIS — M5416 Radiculopathy, lumbar region: Secondary | ICD-10-CM | POA: Diagnosis not present

## 2022-09-08 DIAGNOSIS — M5416 Radiculopathy, lumbar region: Secondary | ICD-10-CM | POA: Diagnosis not present

## 2022-11-04 DIAGNOSIS — M5416 Radiculopathy, lumbar region: Secondary | ICD-10-CM | POA: Diagnosis not present

## 2023-03-12 ENCOUNTER — Encounter (INDEPENDENT_AMBULATORY_CARE_PROVIDER_SITE_OTHER): Payer: Self-pay

## 2023-04-21 ENCOUNTER — Ambulatory Visit (INDEPENDENT_AMBULATORY_CARE_PROVIDER_SITE_OTHER): Payer: BC Managed Care – PPO | Admitting: Family Medicine

## 2023-04-21 ENCOUNTER — Encounter: Payer: Self-pay | Admitting: Neurology

## 2023-04-21 ENCOUNTER — Encounter: Payer: Self-pay | Admitting: Family Medicine

## 2023-04-21 VITALS — BP 121/71 | HR 62 | Temp 97.4°F | Ht 65.35 in | Wt 155.6 lb

## 2023-04-21 DIAGNOSIS — Z125 Encounter for screening for malignant neoplasm of prostate: Secondary | ICD-10-CM

## 2023-04-21 DIAGNOSIS — Z8249 Family history of ischemic heart disease and other diseases of the circulatory system: Secondary | ICD-10-CM | POA: Diagnosis not present

## 2023-04-21 DIAGNOSIS — I77811 Abdominal aortic ectasia: Secondary | ICD-10-CM | POA: Diagnosis not present

## 2023-04-21 DIAGNOSIS — M47819 Spondylosis without myelopathy or radiculopathy, site unspecified: Secondary | ICD-10-CM | POA: Diagnosis not present

## 2023-04-21 DIAGNOSIS — R251 Tremor, unspecified: Secondary | ICD-10-CM | POA: Diagnosis not present

## 2023-04-21 DIAGNOSIS — E785 Hyperlipidemia, unspecified: Secondary | ICD-10-CM | POA: Diagnosis not present

## 2023-04-21 DIAGNOSIS — Z8781 Personal history of (healed) traumatic fracture: Secondary | ICD-10-CM

## 2023-04-21 DIAGNOSIS — Z0000 Encounter for general adult medical examination without abnormal findings: Secondary | ICD-10-CM

## 2023-04-21 DIAGNOSIS — Z131 Encounter for screening for diabetes mellitus: Secondary | ICD-10-CM

## 2023-04-21 LAB — LIPID PANEL
Cholesterol: 162 mg/dL (ref 0–200)
HDL: 75.4 mg/dL (ref >39.00)
LDL Cholesterol: 67 mg/dL (ref 0–99)
NonHDL: 86.38
Total CHOL/HDL Ratio: 2
Triglycerides: 96 mg/dL (ref 0.0–149.0)
VLDL: 19.2 mg/dL (ref 0.0–40.0)

## 2023-04-21 LAB — COMPREHENSIVE METABOLIC PANEL
ALT: 22 U/L (ref 0–53)
AST: 24 U/L (ref 0–37)
Albumin: 4.3 g/dL (ref 3.5–5.2)
Alkaline Phosphatase: 56 U/L (ref 39–117)
BUN: 16 mg/dL (ref 6–23)
CO2: 31 mEq/L (ref 19–32)
Calcium: 10.1 mg/dL (ref 8.4–10.5)
Chloride: 99 mEq/L (ref 96–112)
Creatinine, Ser: 0.97 mg/dL (ref 0.40–1.50)
GFR: 85.04 mL/min (ref >60.00)
Glucose, Bld: 107 mg/dL — ABNORMAL HIGH (ref 70–99)
Potassium: 4.2 mEq/L (ref 3.5–5.1)
Sodium: 138 mEq/L (ref 135–145)
Total Bilirubin: 0.9 mg/dL (ref 0.2–1.2)
Total Protein: 6.9 g/dL (ref 6.0–8.3)

## 2023-04-21 LAB — CBC WITH DIFFERENTIAL/PLATELET
Basophils Absolute: 0 10*3/uL (ref 0.0–0.1)
Basophils Relative: 0.7 % (ref 0.0–3.0)
Eosinophils Absolute: 0 10*3/uL (ref 0.0–0.7)
Eosinophils Relative: 0.8 % (ref 0.0–5.0)
HCT: 38.8 % — ABNORMAL LOW (ref 39.0–52.0)
Hemoglobin: 13 g/dL (ref 13.0–17.0)
Lymphocytes Relative: 22.1 % (ref 12.0–46.0)
Lymphs Abs: 0.9 10*3/uL (ref 0.7–4.0)
MCHC: 33.4 g/dL (ref 30.0–36.0)
MCV: 94.7 fl (ref 78.0–100.0)
Monocytes Absolute: 0.4 10*3/uL (ref 0.1–1.0)
Monocytes Relative: 9.8 % (ref 3.0–12.0)
Neutro Abs: 2.8 10*3/uL (ref 1.4–7.7)
Neutrophils Relative %: 66.6 % (ref 43.0–77.0)
Platelets: 209 10*3/uL (ref 150.0–400.0)
RBC: 4.1 Mil/uL — ABNORMAL LOW (ref 4.22–5.81)
RDW: 13.3 % (ref 11.5–15.5)
WBC: 4.3 10*3/uL (ref 4.0–10.5)

## 2023-04-21 LAB — HEMOGLOBIN A1C: Hgb A1c MFr Bld: 5.6 % (ref 4.6–6.5)

## 2023-04-21 LAB — TSH: TSH: 3.32 u[IU]/mL (ref 0.35–5.50)

## 2023-04-21 LAB — PSA: PSA: 0.75 ng/mL (ref 0.10–4.00)

## 2023-04-21 MED ORDER — ATORVASTATIN CALCIUM 20 MG PO TABS: 20.0000 mg | ORAL_TABLET | Freq: Every evening | ORAL | 3 refills | Status: DC

## 2023-04-21 NOTE — Patient Instructions (Addendum)
Return in about 1 year (around 04/21/2024) for cpe (20 min).        Great to see you today.  I have refilled the medication(s) we provide.   If labs were collected or images ordered, we will inform you of  results once we have received them and reviewed. We will contact you either by echart message, or telephone call.  Please give ample time to the testing facility, and our office to run,  receive and review results. Please do not call inquiring of results, even if you can see them in your chart. We will contact you as soon as we are able. If it has been over 1 week since the test was completed, and you have not yet heard from [place], then please call us.    - echart message- for normal results that have been seen by the patient already.   - telephone call: abnormal results or if patient has not viewed results in their echart.  If a referral to a specialist was entered for you, please call us in 2 weeks if you have not heard from the specialist office to schedule.

## 2023-04-21 NOTE — Progress Notes (Signed)
Patient ID: Christian Watson, male  DOB: 1961-01-20, 62 y.o.   MRN: [id number] Patient Care Team    Relationship Specialty Notifications Start End  Natalia Leatherwood, DO PCP - General Family Medicine  10/11/18   Roslynn Amble, MD (Inactive) Consulting Physician Pulmonary Disease  06/24/17     Chief Complaint  Patient presents with   Annual Exam    Subjective:  Christian Watson is a 62 y.o. male present for CPE and Chronic Conditions/illness Management All past medical history, surgical history, allergies, family history, immunizations, medications and social history were updated in the electronic medical record today. All recent labs, ED visits and hospitalizations within the last year were reviewed.  Health maintenance:  Colonoscopy: Dr. Kinnie Scales 2015/2016- normal - 10 yr Immunizations:  tdap UTD 2016, influenza declined(encourage yearly), covid x3, shingrix completed Infectious disease screening: HIV and Hep C completed PSA:  Lab Results  Component Value Date   PSA 0.63 04/17/2022   PSA 0.50 12/14/2020   PSA 0.79 06/24/2017  , pt was counseled on prostate cancer screenings.  Patient has a Dental home. Hospitalizations/ED visits: reviewed   Hyperlipidemia: Patient reports compliance with atorvastatin 20 mg nightly.  He is fasting today.  Bilateral hand tremor: Pt reports he has noticed a bilateral hand tremor that has worsened over last few months. His wife feels the tremor has been present longer. Pt states tremor is present when resting and with activity, such as eating. He feels the tremor worsens as the day goes on, better in the morning. No fhx of tremors.      04/21/2023    9:42 AM 04/17/2022    9:11 AM 11/12/2021    9:55 AM 12/14/2020    9:46 AM 10/26/2020   11:29 AM  Depression screen PHQ 2/9  Decreased Interest 0 0 0 0 0  Down, Depressed, Hopeless 0 0 0 0 0  PHQ - 2 Score 0 0 0 0 0      04/17/2022    9:11 AM  GAD 7 : Generalized Anxiety Score   Nervous, Anxious, on Edge 0  Control/stop worrying 0  Worry too much - different things 0  Trouble relaxing 0  Restless 0  Easily annoyed or irritable 0  Afraid - awful might happen 0  Total GAD 7 Score 0          04/21/2023    9:41 AM 06/24/2017    8:23 AM  Fall Risk   Falls in the past year? 0 No  Number falls in past yr: 0   Injury with Fall? 0   Risk for fall due to : No Fall Risks   Follow up Falls evaluation completed     Immunization History  Administered Date(s) Administered   PFIZER(Purple Top)SARS-COV-2 Vaccination 10/06/2019, 10/31/2019, 05/18/2020   Tdap 04/03/2015   Zoster Recombinant(Shingrix) 12/14/2020, 03/06/2021   Past Medical History:  Diagnosis Date   Acute bursitis of left shoulder 11/16/2017   Chicken pox    Hip impingement syndrome, right 08/18/2017   Sarcoidosis    No Known Allergies  Past Surgical History:  Procedure Laterality Date   BRONCHOSCOPY     Bronchial Wash & Transbronchial Biopsies RML & RUL   CARPAL TUNNEL RELEASE Left    COLONOSCOPY     cyst removed  as a child   from chin   Family History  Problem Relation Age of Onset   Heart disease Maternal Grandmother    Heart disease Maternal Grandfather  Cancer Maternal Grandfather        unsure what kind   Heart disease Paternal Grandmother    Cancer Paternal Grandmother        unsure what kind   Heart disease Paternal Grandfather    Heart disease Mother    Hypertension Father    Diabetes Father    Hearing loss Father    Diabetes Sister    Lung disease Neg Hx    Sarcoidosis Neg Hx    Rheumatologic disease Neg Hx    Social History   Social History Narrative   Originally from [place]. Has lived in [place], [place], & moved to [place] in 2005. 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 Has prior international travel to [place], [place], [place], [place], [place], [place], & [place]. Has a dog  currently. No bird, mold, or hot tub exposure. Enjoys playing golf.    Married. College educated. Business owner.   Exercises routinely.   Takes a multivitamin, drinks caffeine, uses herbal remedies.   Occasional alcohol.   Wears a bicycle helmet. Has a smoke alarm in the home. Wears his seatbelt.   Safe in his relationships.    Allergies as of 04/21/2023   No Known Allergies      Medication List        Accurate as of April 21, 2023  9:47 AM. If you have any questions, ask your nurse or doctor.          STOP taking these medications    gabapentin 100 MG capsule Commonly known as: NEURONTIN Stopped by: Felix Pacini   methocarbamol 500 MG tablet Commonly known as: ROBAXIN Stopped by: Felix Pacini       TAKE these medications    atorvastatin 20 MG tablet Commonly known as: LIPITOR Take 1 tablet (20 mg total) by mouth every evening.   multivitamin tablet Take 1 tablet by mouth daily.       All past medical history, surgical history, allergies, family history, immunizations andmedications were updated in the EMR today and reviewed under the history and medication portions of their EMR.     No results found for this or any previous visit (from the past 2160 hour(s)).   ROS 14 pt review of systems performed and negative (unless mentioned in an HPI)  Objective: BP 121/71   Pulse 62   Temp (!) 97.4 F (36.3 C)   Ht 5' 5.35" (1.66 m)   Wt 155 lb 9.6 oz (70.6 kg)   SpO2 99%   BMI 25.61 kg/m  Physical Exam Constitutional:      General: He is not in acute distress.    Appearance: Normal appearance. He is not ill-appearing, toxic-appearing or diaphoretic.  HENT:     Head: Normocephalic and atraumatic.     Right Ear: Tympanic membrane, ear canal and external ear normal. There is no impacted cerumen.     Left Ear: Tympanic membrane, ear canal and external ear normal. There is no impacted cerumen.     Nose: Nose normal. No congestion or rhinorrhea.      Mouth/Throat:     Mouth: Mucous membranes are moist.     Pharynx: Oropharynx is clear. No oropharyngeal exudate or posterior oropharyngeal erythema.  Eyes:     General: No scleral icterus.       Right eye: No discharge.        Left eye: No discharge.  Extraocular Movements: Extraocular movements intact.     Pupils: Pupils are equal, round, and reactive to light.  Cardiovascular:     Rate and Rhythm: Normal rate and regular rhythm.     Pulses: Normal pulses.     Heart sounds: Normal heart sounds. No murmur heard.    No friction rub. No gallop.  Pulmonary:     Effort: Pulmonary effort is normal. No respiratory distress.     Breath sounds: Normal breath sounds. No stridor. No wheezing, rhonchi or rales.  Chest:     Chest wall: No tenderness.  Abdominal:     General: Abdomen is flat. Bowel sounds are normal. There is no distension.     Palpations: Abdomen is soft. There is no mass.     Tenderness: There is no abdominal tenderness. There is no right CVA tenderness, left CVA tenderness, guarding or rebound.     Hernia: No hernia is present.  Musculoskeletal:        General: No swelling or tenderness. Normal range of motion.     Cervical back: Normal range of motion and neck supple.     Right lower leg: No edema.     Left lower leg: No edema.  Lymphadenopathy:     Cervical: No cervical adenopathy.  Skin:    General: Skin is warm and dry.     Coloration: Skin is not jaundiced.     Findings: No bruising, lesion or rash.  Neurological:     General: No focal deficit present.     Mental Status: He is alert and oriented to person, place, and time. Mental status is at baseline.     Cranial Nerves: No cranial nerve deficit.     Sensory: No sensory deficit.     Motor: No weakness.     Coordination: Coordination normal.     Gait: Gait normal.     Deep Tendon Reflexes: Reflexes normal.     Comments: B/l hand tremor present  Psychiatric:        Mood and Affect: Mood normal.         Behavior: Behavior normal.        Thought Content: Thought content normal.        Judgment: Judgment normal.     No results found.  Assessment/plan: Christian Watson is a 62 y.o. male present for CPE and Chronic Conditions/illness Management Hyperlipidemia goal < 100/FH heart disease Stable Continue lipitor 20 mg qhs   Ectatic abdominal aorta (HCC) Rpt [place] 2024> ordered Narrative & Impression   IMPRESSION: Mild abdominal aortic ectasia at 2.8 cm. Ectatic abdominal aorta at risk for aneurysm development Recommend followup by ultrasound in 5 years. This recommendation follows ACR consensus guidelines: White Paper of the ACR Incidental Findings Committee II on Vascular     Bilateral hand tremor: New problem.  Discussed options and he is interested in eval by neuro.  Referral to Neuro- Dr. Arbutus Leas placed today.   Prostate cancer screening - PSA Diabetes mellitus screening - Hemoglobin A1c  Routine general medical examination at a health care facility Patient was encouraged to exercise greater than 150 minutes a week. Patient was encouraged to choose a diet filled with fresh fruits and vegetables, and lean meats. AVS provided to patient today for education/recommendation on gender specific health and safety maintenance. Colonoscopy: Dr. Kinnie Scales 2015/2016- normal - 10 yr Immunizations:  tdap UTD 2016, influenza declined(encourage yearly), covid x3, shingrix completed Infectious disease screening: HIV and Hep C completed - CBC with Differential/Platelet -  Comprehensive metabolic panel - TSH Return in about 1 year (around 04/21/2024) for cpe (20 min).   Orders Placed This Encounter  Procedures   US AORTA   CBC with Differential/Platelet   Comprehensive metabolic panel   Hemoglobin A1c   TSH   PSA   Lipid panel   Meds ordered this encounter  Medications   atorvastatin (LIPITOR) 20 MG tablet    Sig: Take 1 tablet (20 mg total) by mouth every evening.    Dispense:  90  tablet    Refill:  3   Referral Orders  No referral(s) requested today     Note is dictated utilizing voice recognition software. Although note has been proof read prior to signing, occasional typographical errors still can be missed. If any questions arise, please do not hesitate to call for verification.  Electronically signed by: Felix Pacini, DO [facility] [place]

## 2023-05-13 NOTE — Progress Notes (Signed)
Assessment/Plan:   Essential tremor  -History and physical are consistent with essential tremor, albeit mild, but technically 1 must have tremor for 3 years before a diagnosis of essential tremor can be made.  His symptoms have only been present for about a year.  -discussion with the patient regarding the complex effect that alcohol can have on tremor.  Chronic alcohol use can produce a tremor but discontinuation of the alcohol can also cause a tremulous state for quite some time.  We talked about decreasing alcohol to no more than 2 days a week.  -We discussed medications for tremor.  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  He finds that tremor is disturbing then.  Propranolol is really not an option because of current bradycardia.  We discussed primidone and he would like to try it.  We discussed risk, benefits, side effects.  We will start with 50 mg at bedtime.   Subjective:   Christian Watson was seen today in the movement disorders clinic for neurologic consultation at the request of Kuneff, Renee A, DO.  The consultation is for the evaluation of hand tremor.  Medical records made available to me are reviewed.  Notes indicate it has been going on for a few months per records but pt reports going on for about a year.    Tremor: Yes.     At rest or with activation?  activation  When is it noted the most?  Fine motor coordination  Fam hx of tremor?  Dad may have some tremor  Located where?  Bilateral UE and they shake equally - he is L handed; wife notes in head as well but he doesn't until she points it out.    Affected by caffeine:  doesn't drink very much  Affected by alcohol:  makes it worse the morning after he has a few drinks; generally has 2-3 wine with dinner and may have beer on weekend  Affected by stress:  unknown  Affected by fatigue:  may make it a little worse  Spills soup if on spoon:  depends on day  Spills glass of liquid  if full:  No.  Affects ADL's (tying shoes, brushing teeth, etc):  No. (Shaves with blade still and does okay)  Tremor inducing meds:  No.  Other Specific Symptoms:  Voice: no change Postural symptoms:  No.  Falls?  No. Bradykinesia symptoms: difficulty getting out of a chair (due to back and gets cortisone shots for it) Loss of smell:  No. Loss of taste:  No. Urinary Incontinence:  No. N/V:  No. Lightheaded:  No.  Syncope: No. Diplopia:  No.  Neuroimaging of the brain has not previously been performed.   PREVIOUS MEDICATIONS: none to date  ALLERGIES:  No Known Allergies  CURRENT MEDICATIONS:  Current Outpatient Medications  Medication Instructions   atorvastatin (LIPITOR) 20 mg, Oral, Every evening   Multiple Vitamin (MULTIVITAMIN) tablet 1 tablet, Oral, Daily    Objective:   PHYSICAL EXAMINATION:    VITALS:   Vitals:   05/18/23 0905  BP: 134/80  Pulse: (!) 54  SpO2: 98%  Weight: 159 lb 6.4 oz (72.3 kg)  Height: 5\' 6"  (1.676 m)    GEN:  The patient appears stated age and is in NAD. HEENT:  Normocephalic, atraumatic.  The mucous membranes are moist. The superficial temporal arteries are without ropiness or tenderness. CV:  brady.  regular Lungs:  CTAB Neck/HEME:  There are  no carotid bruits bilaterally.  Neurological examination:  Orientation: The patient is alert and oriented x3.  Cranial nerves: There is good facial symmetry.  Extraocular muscles are intact. The visual fields are full to confrontational testing. The speech is fluent and clear. Soft palate rises symmetrically and there is no tongue deviation. Hearing is intact to conversational tone. Sensation: Sensation is intact to light touch throughout (facial, trunk, extremities). Vibration is intact at the bilateral big toe. There is no extinction with double simultaneous stimulation.  Motor: Strength is 5/5 in the bilateral upper and lower extremities.   Shoulder shrug is equal and symmetric.  There is no  pronator drift. Deep tendon reflexes: Deep tendon reflexes are 2/4 at the bilateral biceps, triceps, brachioradialis, patella and achilles. Plantar responses are downgoing bilaterally.  Movement examination: Tone: There is normal tone in the bilateral upper extremities.  The tone in the lower extremities is normal.  Abnormal movements: No rest tremor.  No postural tremor.  Very minimal intention tremor.  He has mild tremor with Archimedes spirals.  He has minimal tremor when pouring water from 1 glass to another, mostly when the full glass is in the left hand.     Coordination:  There is no decremation with RAM's, with any form of RAMS, including alternating supination and pronation of the forearm, hand opening and closing, finger taps, heel taps and toe taps.  Gait and Station: The patient has no difficulty arising out of a deep-seated chair without the use of the hands. The patient's stride length is good.  He has minimal trouble ambulating in a tandem fashion.   I have reviewed and interpreted the following labs independently   Chemistry      Component Value Date/Time   NA 138 04/21/2023 0942   K 4.2 04/21/2023 0942   CL 99 04/21/2023 0942   CO2 31 04/21/2023 0942   BUN 16 04/21/2023 0942   CREATININE 0.97 04/21/2023 0942   CREATININE 0.99 12/14/2020 1017      Component Value Date/Time   CALCIUM 10.1 04/21/2023 0942   ALKPHOS 56 04/21/2023 0942   AST 24 04/21/2023 0942   ALT 22 04/21/2023 0942   BILITOT 0.9 04/21/2023 0942      Lab Results  Component Value Date   TSH 3.32 04/21/2023   Lab Results  Component Value Date   WBC 4.3 04/21/2023   HGB 13.0 04/21/2023   HCT 38.8 (L) 04/21/2023   MCV 94.7 04/21/2023   PLT 209.0 04/21/2023    No results found for: "VITAMINB12"   Total time spent on today's visit was 45 minutes, including both face-to-face time and nonface-to-face time.  Time included that spent on review of records (prior notes available to me/labs/imaging  if pertinent), discussing treatment and goals, answering patient's questions and coordinating care.  Cc:  Kuneff, Renee A, DO

## 2023-05-18 ENCOUNTER — Ambulatory Visit: Payer: BC Managed Care – PPO | Admitting: Neurology

## 2023-05-18 ENCOUNTER — Encounter: Payer: Self-pay | Admitting: Neurology

## 2023-05-18 VITALS — BP 134/80 | HR 54 | Ht 66.0 in | Wt 159.4 lb

## 2023-05-18 DIAGNOSIS — R001 Bradycardia, unspecified: Secondary | ICD-10-CM

## 2023-05-18 DIAGNOSIS — G250 Essential tremor: Secondary | ICD-10-CM | POA: Diagnosis not present

## 2023-05-18 MED ORDER — PRIMIDONE 50 MG PO TABS: 50.0000 mg | ORAL_TABLET | Freq: Every day | ORAL | 1 refills | Status: DC

## 2023-05-18 NOTE — Patient Instructions (Addendum)
Start primidone 50 mg - 1/2 tablet at bedtime for 1 week and then increase to 1 tablet at bedtime thereafter.  The physicians and staff at [facility] are committed to providing excellent care. You may receive a survey requesting feedback about your experience at our office. We strive to receive "very good" responses to the survey questions. If you feel that your experience would prevent you from giving the office a "very good " response, please contact our office to try to remedy the situation. We may be reached at [phone]. Thank you for taking the time out of your busy day to complete the survey.  

## 2023-05-21 ENCOUNTER — Ambulatory Visit (HOSPITAL_BASED_OUTPATIENT_CLINIC_OR_DEPARTMENT_OTHER)
Admission: RE | Admit: 2023-05-21 | Discharge: 2023-05-21 | Disposition: A | Discharge status: Home / Self Care | Payer: BC Managed Care – PPO | Source: Ambulatory Visit | Attending: Family Medicine | Admitting: Family Medicine

## 2023-05-21 DIAGNOSIS — Z0389 Encounter for observation for other suspected diseases and conditions ruled out: Secondary | ICD-10-CM | POA: Diagnosis not present

## 2023-05-21 DIAGNOSIS — I77811 Abdominal aortic ectasia: Secondary | ICD-10-CM | POA: Insufficient documentation

## 2023-05-27 DIAGNOSIS — R0681 Apnea, not elsewhere classified: Secondary | ICD-10-CM | POA: Diagnosis not present

## 2023-05-28 DIAGNOSIS — R0681 Apnea, not elsewhere classified: Secondary | ICD-10-CM | POA: Diagnosis not present

## 2023-06-03 DIAGNOSIS — G4733 Obstructive sleep apnea (adult) (pediatric): Secondary | ICD-10-CM | POA: Diagnosis not present

## 2023-06-05 ENCOUNTER — Encounter: Payer: Self-pay | Admitting: Family Medicine

## 2023-06-15 DIAGNOSIS — G4733 Obstructive sleep apnea (adult) (pediatric): Secondary | ICD-10-CM | POA: Diagnosis not present

## 2023-07-15 DIAGNOSIS — G4733 Obstructive sleep apnea (adult) (pediatric): Secondary | ICD-10-CM | POA: Diagnosis not present

## 2023-11-09 ENCOUNTER — Other Ambulatory Visit: Payer: Self-pay | Admitting: Neurology

## 2023-11-09 DIAGNOSIS — G250 Essential tremor: Secondary | ICD-10-CM

## 2023-11-16 NOTE — Progress Notes (Unsigned)
 Assessment/Plan:    1.  Essential Tremor  History and physical are consistent with essential tremor, albeit mild, but technically one must have tremor for 3 years before a diagnosis of essential tremor can be made. His symptoms have only been present for about a year and a half.   -continue Primidone , 50 mg nightly.  This has worked well.    - Propranolol is not really an option because of bradycardia  - We have discussed the complex effect that alcohol can have on tremor.  -pt has specialist copay for our office.  If possible, he would like to get refills as pcp.  I am happy to refill x 1 year.  I think pcp will be agreeable and if so, he will f/u here prn.  Will send note to pcp.  Subjective:   Christian Watson was seen today in follow up for tremor.  My previous records were reviewed prior to todays visit.  Patient was started on low-dose primidone  last visit.  He is tolerating medication well, without side effects.  Its working well.  Pt denies falls.    Current prescribed movement disorder medications: Primidone , 50 mg daily (started last visit)   ALLERGIES:  No Known Allergies  CURRENT MEDICATIONS:  Current Meds  Medication Sig   atorvastatin  (LIPITOR) 20 MG tablet Take 1 tablet (20 mg total) by mouth every evening.   Multiple Vitamin (MULTIVITAMIN) tablet Take 1 tablet by mouth daily.   primidone  (MYSOLINE ) 50 MG tablet TAKE 1 TABLET(50 MG) BY MOUTH AT BEDTIME      Objective:    PHYSICAL EXAMINATION:    VITALS:   Vitals:   11/17/23 0837  BP: 116/70  Pulse: 68  SpO2: 98%  Weight: 153 lb 12.8 oz (69.8 kg)  Height: 5\' 6"  (1.676 m)    GEN:  The patient appears stated age and is in NAD. HEENT:  Normocephalic, atraumatic.  The mucous membranes are moist. The superficial temporal arteries are without ropiness or tenderness. CV:  RRR Lungs:  CTAB Neck/HEME:  There are no carotid bruits bilaterally.  Neurological examination:  Orientation: The patient is  alert and oriented x3. Cranial nerves: There is good facial symmetry. The speech is fluent and clear. Soft palate rises symmetrically and there is no tongue deviation. Hearing is intact to conversational tone. Sensation: Sensation is intact to light touch throughout Motor: Strength is at least antigravity x4.  Movement examination: Tone: There is normal tone in the bilateral upper extremities.  The tone in the lower extremities is normal.  Abnormal movements: No rest tremor.  No postural tremor. No intention tremor.  he has no difficulty with archimedes spirals.  Gait and Station: The patient has no difficulty arising out of a deep-seated chair without the use of the hands. The patient's stride length is good I have reviewed and interpreted the following labs independently   Chemistry      Component Value Date/Time   NA 138 04/21/2023 0942   K 4.2 04/21/2023 0942   CL 99 04/21/2023 0942   CO2 31 04/21/2023 0942   BUN 16 04/21/2023 0942   CREATININE 0.97 04/21/2023 0942   CREATININE 0.99 12/14/2020 1017      Component Value Date/Time   CALCIUM  10.1 04/21/2023 0942   ALKPHOS 56 04/21/2023 0942   AST 24 04/21/2023 0942   ALT 22 04/21/2023 0942   BILITOT 0.9 04/21/2023 0942      Lab Results  Component Value Date   WBC 4.3  04/21/2023   HGB 13.0 04/21/2023   HCT 38.8 (L) 04/21/2023   MCV 94.7 04/21/2023   PLT 209.0 04/21/2023   Lab Results  Component Value Date   TSH 3.32 04/21/2023     Chemistry      Component Value Date/Time   NA 138 04/21/2023 0942   K 4.2 04/21/2023 0942   CL 99 04/21/2023 0942   CO2 31 04/21/2023 0942   BUN 16 04/21/2023 0942   CREATININE 0.97 04/21/2023 0942   CREATININE 0.99 12/14/2020 1017      Component Value Date/Time   CALCIUM  10.1 04/21/2023 0942   ALKPHOS 56 04/21/2023 0942   AST 24 04/21/2023 0942   ALT 22 04/21/2023 0942   BILITOT 0.9 04/21/2023 0942        Cc:  Kuneff, Renee A, DO

## 2023-11-17 ENCOUNTER — Ambulatory Visit: Payer: BC Managed Care – PPO | Admitting: Neurology

## 2023-11-17 VITALS — BP 116/70 | HR 68 | Ht 66.0 in | Wt 153.8 lb

## 2023-11-17 DIAGNOSIS — G250 Essential tremor: Secondary | ICD-10-CM

## 2024-02-17 ENCOUNTER — Other Ambulatory Visit: Payer: Self-pay | Admitting: Neurology

## 2024-02-17 DIAGNOSIS — G250 Essential tremor: Secondary | ICD-10-CM

## 2024-04-21 ENCOUNTER — Other Ambulatory Visit: Payer: Self-pay | Admitting: Medical Genetics

## 2024-04-22 ENCOUNTER — Encounter: Payer: Self-pay | Admitting: Family Medicine

## 2024-04-22 ENCOUNTER — Ambulatory Visit: Payer: BC Managed Care – PPO | Admitting: Family Medicine

## 2024-04-22 VITALS — BP 124/76 | HR 63 | Temp 97.9°F | Ht 66.0 in | Wt 158.4 lb

## 2024-04-22 DIAGNOSIS — Z8249 Family history of ischemic heart disease and other diseases of the circulatory system: Secondary | ICD-10-CM

## 2024-04-22 DIAGNOSIS — E785 Hyperlipidemia, unspecified: Secondary | ICD-10-CM

## 2024-04-22 DIAGNOSIS — Z23 Encounter for immunization: Secondary | ICD-10-CM

## 2024-04-22 DIAGNOSIS — R251 Tremor, unspecified: Secondary | ICD-10-CM | POA: Diagnosis not present

## 2024-04-22 DIAGNOSIS — Z0001 Encounter for general adult medical examination with abnormal findings: Secondary | ICD-10-CM

## 2024-04-22 DIAGNOSIS — Z0000 Encounter for general adult medical examination without abnormal findings: Secondary | ICD-10-CM

## 2024-04-22 DIAGNOSIS — Z131 Encounter for screening for diabetes mellitus: Secondary | ICD-10-CM

## 2024-04-22 DIAGNOSIS — Z125 Encounter for screening for malignant neoplasm of prostate: Secondary | ICD-10-CM | POA: Diagnosis not present

## 2024-04-22 DIAGNOSIS — G250 Essential tremor: Secondary | ICD-10-CM

## 2024-04-22 LAB — LIPID PANEL
Cholesterol: 162 mg/dL (ref 0–200)
HDL: 67.4 mg/dL (ref >39.00)
LDL Cholesterol: 70 mg/dL (ref 0–99)
NonHDL: 94.69
Total CHOL/HDL Ratio: 2
Triglycerides: 122 mg/dL (ref 0.0–149.0)
VLDL: 24.4 mg/dL (ref 0.0–40.0)

## 2024-04-22 LAB — COMPREHENSIVE METABOLIC PANEL WITH GFR
ALT: 20 U/L (ref 0–53)
AST: 19 U/L (ref 0–37)
Albumin: 4.5 g/dL (ref 3.5–5.2)
Alkaline Phosphatase: 58 U/L (ref 39–117)
BUN: 18 mg/dL (ref 6–23)
CO2: 32 meq/L (ref 19–32)
Calcium: 9.7 mg/dL (ref 8.4–10.5)
Chloride: 100 meq/L (ref 96–112)
Creatinine, Ser: 0.84 mg/dL (ref 0.40–1.50)
GFR: 94.33 mL/min (ref >60.00)
Glucose, Bld: 113 mg/dL — ABNORMAL HIGH (ref 70–99)
Potassium: 4.5 meq/L (ref 3.5–5.1)
Sodium: 137 meq/L (ref 135–145)
Total Bilirubin: 0.4 mg/dL (ref 0.2–1.2)
Total Protein: 7.1 g/dL (ref 6.0–8.3)

## 2024-04-22 LAB — PSA: PSA: 0.62 ng/mL (ref 0.10–4.00)

## 2024-04-22 LAB — CBC
HCT: 38.8 % — ABNORMAL LOW (ref 39.0–52.0)
Hemoglobin: 12.9 g/dL — ABNORMAL LOW (ref 13.0–17.0)
MCHC: 33.2 g/dL (ref 30.0–36.0)
MCV: 94.7 fl (ref 78.0–100.0)
Platelets: 203 K/uL (ref 150.0–400.0)
RBC: 4.1 Mil/uL — ABNORMAL LOW (ref 4.22–5.81)
RDW: 13.4 % (ref 11.5–15.5)
WBC: 3.8 K/uL — ABNORMAL LOW (ref 4.0–10.5)

## 2024-04-22 LAB — TSH: TSH: 2.28 u[IU]/mL (ref 0.35–5.50)

## 2024-04-22 LAB — HEMOGLOBIN A1C: Hgb A1c MFr Bld: 5.8 % (ref 4.6–6.5)

## 2024-04-22 MED ORDER — ATORVASTATIN CALCIUM 20 MG PO TABS: 20.0000 mg | ORAL_TABLET | Freq: Every evening | ORAL | 3 refills | Status: AC

## 2024-04-22 MED ORDER — PRIMIDONE 50 MG PO TABS: 50.0000 mg | ORAL_TABLET | Freq: Every day | ORAL | 1 refills | Status: AC

## 2024-04-22 NOTE — Progress Notes (Signed)
 Patient ID: Christian Watson, male  DOB: 12-18-1960, 63 y.o.   MRN: [id number] Patient Care Team    Relationship Specialty Notifications Start End  Catherine Charlies LABOR, DO PCP - General Family Medicine  10/11/18   Noreen Tonnie BRAVO, MD Consulting Physician Pulmonary Disease  06/24/17     Chief Complaint  Patient presents with   Annual Exam    Pt is fasting.  Chronic Conditions/illness Management Influenza vac- declined Prevnar- declined Tdap- given    Subjective:  Christian Watson is a 63 y.o. male present for CPE and Chronic Conditions/illness Management All past medical history, surgical history, allergies, family history, immunizations, medications and social history were updated in the electronic medical record today. All recent labs, ED visits and hospitalizations within the last year were reviewed.  Health maintenance:  Colonoscopy: 05/23/2021 -Dr. Luis - normal - 10 yr Immunizations:  tdap UTD 2016, influenza declined(encourage yearly), covid x3, shingrix completed, prevnar declined Infectious disease screening: HIV and Hep C completed PSA:  Lab Results  Component Value Date   PSA 0.75 04/21/2023   PSA 0.63 04/17/2022   PSA 0.50 12/14/2020  , pt was counseled on prostate cancer screenings.  Patient has a Dental home. Hospitalizations/ED visits: reviewed   Hyperlipidemia: Patient reports compliance with atorvastatin  20 mg nightly.  He is fasting today.  Bilateral hand tremor: Patient was established with neurology and prescribed Mirapex 50 mg nightly which has historically well-controlled.  He would like to follow here for this chronic condition, since condition is stable. Prior note Pt reports he has noticed a bilateral hand tremor that has worsened over last few months. His wife feels the tremor has been present longer. Pt states tremor is present when resting and with activity, such as eating. He feels the tremor worsens as the day goes on, better in the  morning. No fhx of tremors.      04/22/2024    8:38 AM 04/21/2023    9:42 AM 04/17/2022    9:11 AM 11/12/2021    9:55 AM 12/14/2020    9:46 AM  Depression screen PHQ 2/9  Decreased Interest 0 0 0 0 0  Down, Depressed, Hopeless 0 0 0 0 0  PHQ - 2 Score 0 0 0 0 0  Altered sleeping 0      Tired, decreased energy 0      Change in appetite 0      Feeling bad or failure about yourself  0      Trouble concentrating 0      Moving slowly or fidgety/restless 0      Suicidal thoughts 0      PHQ-9 Score 0      Difficult doing work/chores Not difficult at all          04/22/2024    8:38 AM 04/17/2022    9:11 AM  GAD 7 : Generalized Anxiety Score  Nervous, Anxious, on Edge 0 0  Control/stop worrying 0 0  Worry too much - different things 0 0  Trouble relaxing 0 0  Restless 0 0  Easily annoyed or irritable 0 0  Afraid - awful might happen 0 0  Total GAD 7 Score 0 0  Anxiety Difficulty Not difficult at all           04/22/2024    8:38 AM 11/17/2023    8:36 AM 05/18/2023    9:08 AM 04/21/2023    9:41 AM 06/24/2017    8:23 AM  Fall Risk   Falls in the past year? 0 0 0 0 No   Number falls in past yr: 0 0 0 0   Injury with Fall? 0 0 0 0   Risk for fall due to :    No Fall Risks   Follow up Falls evaluation completed Falls evaluation completed  Falls evaluation completed      Data saved with a previous flowsheet row definition    Immunization History  Administered Date(s) Administered   PFIZER(Purple Top)SARS-COV-2 Vaccination 10/06/2019, 10/31/2019, 05/18/2020   Tdap 04/03/2015, 04/22/2024   Zoster Recombinant(Shingrix) 12/14/2020, 03/06/2021   Past Medical History:  Diagnosis Date   Acute bursitis of left shoulder 11/16/2017   Chicken pox    Ectatic abdominal aorta 12/17/2020   Ultrasound 2024-normal  no further image required   Erectile dysfunction 04/16/2018   Hip impingement syndrome, right 08/18/2017   Hyperlipidemia    Sarcoidosis    No Known Allergies  Past  Surgical History:  Procedure Laterality Date   BRONCHOSCOPY     Bronchial Wash & Transbronchial Biopsies RML & RUL   CARPAL TUNNEL RELEASE Left    COLONOSCOPY     cyst removed  as a child   from chin   Family History  Problem Relation Age of Onset   Dementia Mother    Heart disease Mother    Hypertension Father    Diabetes Father    Hearing loss Father    Diabetes Sister    Heart disease Maternal Grandmother    Heart disease Maternal Grandfather    Cancer Maternal Grandfather        unsure what kind   Heart disease Paternal Grandmother    Cancer Paternal Grandmother        unsure what kind   Heart disease Paternal Grandfather    Lung disease Neg Hx    Sarcoidosis Neg Hx    Rheumatologic disease Neg Hx    Social History   Social History Narrative   Originally from [place]. Has lived in [place], [place], & moved to [place] in 2005. 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 Has prior international travel to [place], [place], [place], [place], [place], [place], & [place]. No bird, mold, or Enjoys playing golf.    Married. College educated. Business owner.   Exercises routinely.   Takes a multivitamin, drinks caffeine, uses herbal remedies.   Occasional alcohol.   Wears a bicycle helmet. Has a smoke alarm in the home. Wears his seatbelt.   Safe in his relationships.   LEFT HANDED     Allergies as of 04/22/2024   No Known Allergies      Medication List        Accurate as of April 22, 2024 12:06 PM. If you have any questions, ask your nurse or doctor.          atorvastatin  20 MG tablet Commonly known as: LIPITOR Take 1 tablet (20 mg total) by mouth every evening.   multivitamin tablet Take 1 tablet by mouth daily.   primidone  50 MG tablet Commonly known as: MYSOLINE  Take 1 tablet (50 mg total) by mouth at bedtime. What changed: See the new instructions. Changed by: Charlies Bellini       All past medical history, surgical history, allergies, family history, immunizations andmedications were updated in the EMR today and reviewed under the history and medication portions of their EMR.  No results found for this or any previous visit (from the past 2160 hours).   Review of Systems  All other systems reviewed and are negative.  14 pt review of systems performed and negative (unless mentioned in an HPI)  Objective: BP 124/76   Pulse 63   Temp 97.9 F (36.6 C)   Ht 5' 6 (1.676 m)   Wt 158 lb 6.4 oz (71.8 kg)   SpO2 97%   BMI 25.57 kg/m  Physical Exam Constitutional:      General: He is not in acute distress.    Appearance: Normal appearance. He is not ill-appearing, toxic-appearing or diaphoretic.  HENT:     Head: Normocephalic and atraumatic.     Right Ear: Tympanic membrane, ear canal and external ear normal. There is no impacted cerumen.     Left Ear: Tympanic membrane, ear canal and external ear normal. There is no impacted cerumen.     Nose: Nose normal. No congestion or rhinorrhea.     Mouth/Throat:     Mouth: Mucous membranes are moist.     Pharynx: Oropharynx is clear. No oropharyngeal exudate or posterior oropharyngeal erythema.  Eyes:     General: No scleral icterus.       Right eye: No discharge.        Left eye: No discharge.     Extraocular Movements: Extraocular movements intact.     Pupils: Pupils are equal, round, and reactive to light.  Cardiovascular:     Rate and Rhythm: Normal rate and regular rhythm.     Pulses: Normal pulses.     Heart sounds: Normal heart sounds. No murmur heard.    No friction rub. No gallop.  Pulmonary:     Effort: Pulmonary effort is normal. No respiratory distress.     Breath sounds: Normal breath sounds. No stridor. No wheezing, rhonchi or rales.  Chest:     Chest wall: No tenderness.  Abdominal:     General: Abdomen is flat. Bowel sounds are normal. There is no distension.     Palpations:  Abdomen is soft. There is no mass.     Tenderness: There is no abdominal tenderness. There is no right CVA tenderness, left CVA tenderness, guarding or rebound.     Hernia: No hernia is present.  Musculoskeletal:        General: No swelling or tenderness. Normal range of motion.     Cervical back: Normal range of motion and neck supple.     Right lower leg: No edema.     Left lower leg: No edema.  Lymphadenopathy:     Cervical: No cervical adenopathy.  Skin:    General: Skin is warm and dry.     Coloration: Skin is not jaundiced.     Findings: No bruising, lesion or rash.  Neurological:     General: No focal deficit present.     Mental Status: He is alert and oriented to person, place, and time. Mental status is at baseline.     Cranial Nerves: No cranial nerve deficit.     Sensory: No sensory deficit.     Motor: No weakness.     Coordination: Coordination normal.     Gait: Gait normal.     Deep Tendon Reflexes: Reflexes normal.     Comments: B/l hand tremor present  Psychiatric:        Mood and Affect: Mood normal.        Behavior: Behavior normal.  Thought Content: Thought content normal.        Judgment: Judgment normal.     No results found.  Assessment/plan: TIYON SANOR is a 63 y.o. male present for CPE and Conditions/illness Management Hyperlipidemia goal < 100/FH heart disease Stable Continue  lipitor 20 mg at bedtime Lipids collected today  Essential tremor/bilateral hand tremor: Stable Continue Mirapex 50 mg nightly  Diabetes mellitus screening - Hemoglobin A1c Prostate cancer screening - PSA Influenza vaccine needed Declined Need for vaccination for pneumococcus Declined Need for Tdap vaccination - Tdap vaccine greater than or equal to 7yo IM   Routine general medical examination at a health care facility Colonoscopy: 05/23/2021 -Dr. Luis - normal - 10 yr Immunizations:  tdap updated today, influenza declined(encourage yearly), covid  x3, shingrix completed, prevnar declined Infectious disease screening: HIV and Hep C completed PSA: Collected today Patient was encouraged to exercise greater than 150 minutes a week. Patient was encouraged to choose a diet filled with fresh fruits and vegetables, and lean meats. AVS provided to patient today for education/recommendation on gender specific health and safety maintenance.  Return in about 25 weeks (around 10/14/2024) for Routine chronic condition follow-up.   Orders Placed This Encounter  Procedures   Tdap vaccine greater than or equal to 7yo IM   CBC   Comprehensive metabolic panel with GFR   Hemoglobin A1c   Lipid panel   PSA   TSH   Meds ordered this encounter  Medications   atorvastatin  (LIPITOR) 20 MG tablet    Sig: Take 1 tablet (20 mg total) by mouth every evening.    Dispense:  90 tablet    Refill:  3   primidone  (MYSOLINE ) 50 MG tablet    Sig: Take 1 tablet (50 mg total) by mouth at bedtime.    Dispense:  90 tablet    Refill:  1   Referral Orders  No referral(s) requested today     Note is dictated utilizing voice recognition software. Although note has been proof read prior to signing, occasional typographical errors still can be missed. If any questions arise, please do not hesitate to call for verification.  Electronically signed by: Charlies Bellini, DO [facility] [place]

## 2024-04-22 NOTE — Patient Instructions (Addendum)
 Return in about 25 weeks (around 10/14/2024) for Routine chronic condition follow-up.        Great to see you today.  I have refilled the medication(s) we provide.   If labs were collected or images ordered, we will inform you of  results once we have received them and reviewed. We will contact you either by echart message, or telephone call.  Please give ample time to the testing facility, and our office to run,  receive and review results. Please do not call inquiring of results, even if you can see them in your chart. We will contact you as soon as we are able. If it has been over 1 week since the test was completed, and you have not yet heard from us , then please call us .    - echart message- for normal results that have been seen by the patient already.   - telephone call: abnormal results or if patient has not viewed results in their echart.  If a referral to a specialist was entered for you, please call us  in 2 weeks if you have not heard from the specialist office to schedule.

## 2024-04-25 ENCOUNTER — Ambulatory Visit: Payer: Self-pay | Admitting: Family Medicine

## 2024-05-19 ENCOUNTER — Other Ambulatory Visit: Payer: Self-pay | Admitting: Neurology

## 2024-05-19 ENCOUNTER — Encounter: Payer: Self-pay | Admitting: Neurology

## 2024-05-19 DIAGNOSIS — G250 Essential tremor: Secondary | ICD-10-CM

## 2024-10-13 ENCOUNTER — Ambulatory Visit: Admitting: Family Medicine
